# Patient Record
Sex: Male | Born: 1975 | Race: White | Hispanic: No | Marital: Married
Health system: Southern US, Community
[De-identification: ages and names within clinical notes are randomized; demographics above are authoritative.]

---

## 2002-06-22 ENCOUNTER — Encounter: Payer: Self-pay | Admitting: Emergency Medicine

## 2002-06-22 ENCOUNTER — Emergency Department (HOSPITAL_COMMUNITY): Admission: EM | Admit: 2002-06-22 | Discharge: 2002-06-22 | Payer: Self-pay | Admitting: Emergency Medicine

## 2006-10-08 ENCOUNTER — Ambulatory Visit: Payer: Self-pay | Admitting: Family Medicine

## 2006-10-10 ENCOUNTER — Encounter: Payer: Self-pay | Admitting: Family Medicine

## 2006-10-10 DIAGNOSIS — F411 Generalized anxiety disorder: Secondary | ICD-10-CM | POA: Insufficient documentation

## 2006-10-10 DIAGNOSIS — F329 Major depressive disorder, single episode, unspecified: Secondary | ICD-10-CM

## 2006-10-10 DIAGNOSIS — E669 Obesity, unspecified: Secondary | ICD-10-CM

## 2006-10-10 DIAGNOSIS — K219 Gastro-esophageal reflux disease without esophagitis: Secondary | ICD-10-CM | POA: Insufficient documentation

## 2006-10-10 DIAGNOSIS — M545 Low back pain: Secondary | ICD-10-CM

## 2006-10-10 DIAGNOSIS — G4730 Sleep apnea, unspecified: Secondary | ICD-10-CM | POA: Insufficient documentation

## 2006-10-14 ENCOUNTER — Encounter (INDEPENDENT_AMBULATORY_CARE_PROVIDER_SITE_OTHER): Payer: Self-pay | Admitting: Family Medicine

## 2006-10-14 LAB — CONVERTED CEMR LAB
ALT: 28 units/L (ref 0–53)
AST: 26 units/L (ref 0–37)
Cholesterol: 201 mg/dL — ABNORMAL HIGH (ref 0–200)
Creatinine, Ser: 0.92 mg/dL (ref 0.40–1.50)
Eosinophils Relative: 3 % (ref 0–5)
HDL: 34 mg/dL — ABNORMAL LOW (ref >39)
Hemoglobin: 14.2 g/dL (ref 13.0–17.0)
LDL Cholesterol: 143 mg/dL — ABNORMAL HIGH (ref 0–99)
Lymphocytes Relative: 37 % (ref 12–46)
MCHC: 32.9 g/dL (ref 30.0–36.0)
MCV: 89 fL (ref 78.0–100.0)
Monocytes Absolute: 0.6 10*3/uL (ref 0.2–0.7)
Monocytes Relative: 9 % (ref 3–11)
Neutro Abs: 3.4 10*3/uL (ref 1.7–7.7)
Neutrophils Relative %: 51 % (ref 43–77)
RBC: 4.84 M/uL (ref 4.22–5.81)
Sodium: 142 meq/L (ref 135–145)
Total CHOL/HDL Ratio: 5.9
Total Protein: 6.6 g/dL (ref 6.0–8.3)
Triglycerides: 121 mg/dL (ref <150)
VLDL: 24 mg/dL (ref 0–40)

## 2006-11-02 ENCOUNTER — Encounter (INDEPENDENT_AMBULATORY_CARE_PROVIDER_SITE_OTHER): Payer: Self-pay | Admitting: Family Medicine

## 2006-11-05 ENCOUNTER — Ambulatory Visit: Payer: Self-pay | Admitting: Family Medicine

## 2006-11-05 DIAGNOSIS — E785 Hyperlipidemia, unspecified: Secondary | ICD-10-CM

## 2006-11-05 LAB — CONVERTED CEMR LAB: Cholesterol, target level: 200 mg/dL

## 2006-11-06 ENCOUNTER — Encounter (INDEPENDENT_AMBULATORY_CARE_PROVIDER_SITE_OTHER): Payer: Self-pay | Admitting: Family Medicine

## 2006-11-10 LAB — CONVERTED CEMR LAB
ALT: 34 units/L (ref 0–53)
Alkaline Phosphatase: 61 units/L (ref 39–117)
Calcium: 9.7 mg/dL (ref 8.4–10.5)
Total Protein: 7.5 g/dL (ref 6.0–8.3)

## 2006-11-29 ENCOUNTER — Ambulatory Visit: Payer: Self-pay | Admitting: Family Medicine

## 2006-12-24 ENCOUNTER — Telehealth (INDEPENDENT_AMBULATORY_CARE_PROVIDER_SITE_OTHER): Payer: Self-pay | Admitting: Family Medicine

## 2007-01-17 ENCOUNTER — Encounter (INDEPENDENT_AMBULATORY_CARE_PROVIDER_SITE_OTHER): Payer: Self-pay | Admitting: Family Medicine

## 2007-01-21 ENCOUNTER — Telehealth (INDEPENDENT_AMBULATORY_CARE_PROVIDER_SITE_OTHER): Payer: Self-pay | Admitting: Family Medicine

## 2007-02-04 ENCOUNTER — Ambulatory Visit: Payer: Self-pay | Admitting: Family Medicine

## 2007-02-08 ENCOUNTER — Encounter (INDEPENDENT_AMBULATORY_CARE_PROVIDER_SITE_OTHER): Payer: Self-pay | Admitting: Family Medicine

## 2007-04-07 ENCOUNTER — Encounter (INDEPENDENT_AMBULATORY_CARE_PROVIDER_SITE_OTHER): Payer: Self-pay | Admitting: Family Medicine

## 2007-07-12 ENCOUNTER — Inpatient Hospital Stay (HOSPITAL_COMMUNITY): Admission: EM | Admit: 2007-07-12 | Discharge: 2007-07-14 | Payer: Self-pay | Admitting: Emergency Medicine

## 2007-07-14 ENCOUNTER — Encounter (INDEPENDENT_AMBULATORY_CARE_PROVIDER_SITE_OTHER): Payer: Self-pay | Admitting: Family Medicine

## 2007-07-18 ENCOUNTER — Encounter (INDEPENDENT_AMBULATORY_CARE_PROVIDER_SITE_OTHER): Payer: Self-pay | Admitting: Family Medicine

## 2007-07-19 ENCOUNTER — Ambulatory Visit: Payer: Self-pay | Admitting: Family Medicine

## 2007-07-19 LAB — CONVERTED CEMR LAB
Bilirubin Urine: NEGATIVE
Glucose, Urine, Semiquant: NEGATIVE
Nitrite: NEGATIVE
Urobilinogen, UA: 1
pH: 7

## 2007-07-20 ENCOUNTER — Telehealth (INDEPENDENT_AMBULATORY_CARE_PROVIDER_SITE_OTHER): Payer: Self-pay | Admitting: *Deleted

## 2007-07-20 LAB — CONVERTED CEMR LAB
HCT: 37.5 % — ABNORMAL LOW (ref 39.0–52.0)
MCV: 96.9 fL (ref 78.0–100.0)
Monocytes Relative: 9 % (ref 3–11)
Neutro Abs: 4.1 10*3/uL (ref 1.7–7.7)
Neutrophils Relative %: 66 % (ref 43–77)
RBC: 3.87 M/uL — ABNORMAL LOW (ref 4.22–5.81)
RDW: 14.6 % — ABNORMAL HIGH (ref 11.5–14.0)
WBC: 6.2 10*3/uL (ref 4.0–10.5)

## 2007-07-29 ENCOUNTER — Telehealth (INDEPENDENT_AMBULATORY_CARE_PROVIDER_SITE_OTHER): Payer: Self-pay | Admitting: Family Medicine

## 2007-08-01 ENCOUNTER — Telehealth (INDEPENDENT_AMBULATORY_CARE_PROVIDER_SITE_OTHER): Payer: Self-pay | Admitting: *Deleted

## 2007-08-01 ENCOUNTER — Ambulatory Visit: Payer: Self-pay | Admitting: Family Medicine

## 2007-08-01 ENCOUNTER — Ambulatory Visit (HOSPITAL_COMMUNITY): Admission: RE | Admit: 2007-08-01 | Discharge: 2007-08-01 | Payer: Self-pay | Admitting: Family Medicine

## 2007-08-02 ENCOUNTER — Encounter (INDEPENDENT_AMBULATORY_CARE_PROVIDER_SITE_OTHER): Payer: Self-pay | Admitting: Family Medicine

## 2007-08-05 ENCOUNTER — Encounter (INDEPENDENT_AMBULATORY_CARE_PROVIDER_SITE_OTHER): Payer: Self-pay | Admitting: Family Medicine

## 2007-08-05 ENCOUNTER — Telehealth (INDEPENDENT_AMBULATORY_CARE_PROVIDER_SITE_OTHER): Payer: Self-pay | Admitting: Family Medicine

## 2007-08-12 ENCOUNTER — Telehealth (INDEPENDENT_AMBULATORY_CARE_PROVIDER_SITE_OTHER): Payer: Self-pay | Admitting: Family Medicine

## 2007-08-16 ENCOUNTER — Ambulatory Visit (HOSPITAL_COMMUNITY): Admission: RE | Admit: 2007-08-16 | Discharge: 2007-08-16 | Payer: Self-pay | Admitting: Family Medicine

## 2007-08-22 ENCOUNTER — Telehealth (INDEPENDENT_AMBULATORY_CARE_PROVIDER_SITE_OTHER): Payer: Self-pay | Admitting: *Deleted

## 2007-08-26 ENCOUNTER — Encounter (INDEPENDENT_AMBULATORY_CARE_PROVIDER_SITE_OTHER): Payer: Self-pay | Admitting: Family Medicine

## 2007-08-31 ENCOUNTER — Encounter (INDEPENDENT_AMBULATORY_CARE_PROVIDER_SITE_OTHER): Payer: Self-pay | Admitting: Family Medicine

## 2007-10-13 ENCOUNTER — Encounter (INDEPENDENT_AMBULATORY_CARE_PROVIDER_SITE_OTHER): Payer: Self-pay | Admitting: Family Medicine

## 2007-11-10 ENCOUNTER — Encounter (INDEPENDENT_AMBULATORY_CARE_PROVIDER_SITE_OTHER): Payer: Self-pay | Admitting: Family Medicine

## 2007-11-10 ENCOUNTER — Telehealth (INDEPENDENT_AMBULATORY_CARE_PROVIDER_SITE_OTHER): Payer: Self-pay | Admitting: *Deleted

## 2007-12-08 ENCOUNTER — Encounter (INDEPENDENT_AMBULATORY_CARE_PROVIDER_SITE_OTHER): Payer: Self-pay | Admitting: Family Medicine

## 2007-12-29 ENCOUNTER — Encounter (INDEPENDENT_AMBULATORY_CARE_PROVIDER_SITE_OTHER): Payer: Self-pay | Admitting: Family Medicine

## 2008-01-11 ENCOUNTER — Encounter (INDEPENDENT_AMBULATORY_CARE_PROVIDER_SITE_OTHER): Payer: Self-pay | Admitting: Family Medicine

## 2008-01-16 ENCOUNTER — Ambulatory Visit: Payer: Self-pay | Admitting: Family Medicine

## 2008-01-16 ENCOUNTER — Telehealth (INDEPENDENT_AMBULATORY_CARE_PROVIDER_SITE_OTHER): Payer: Self-pay | Admitting: *Deleted

## 2008-01-16 ENCOUNTER — Ambulatory Visit (HOSPITAL_COMMUNITY): Admission: RE | Admit: 2008-01-16 | Discharge: 2008-01-16 | Payer: Self-pay | Admitting: Family Medicine

## 2008-01-16 DIAGNOSIS — M79609 Pain in unspecified limb: Secondary | ICD-10-CM

## 2008-01-17 ENCOUNTER — Telehealth (INDEPENDENT_AMBULATORY_CARE_PROVIDER_SITE_OTHER): Payer: Self-pay | Admitting: *Deleted

## 2008-01-17 LAB — CONVERTED CEMR LAB
ALT: 40 units/L (ref 0–53)
AST: 32 units/L (ref 0–37)
Albumin: 4.4 g/dL (ref 3.5–5.2)
BUN: 11 mg/dL (ref 6–23)
Basophils Absolute: 0 10*3/uL (ref 0.0–0.1)
CO2: 21 meq/L (ref 19–32)
Calcium: 8.7 mg/dL (ref 8.4–10.5)
Chloride: 106 meq/L (ref 96–112)
Creatinine, Ser: 0.88 mg/dL (ref 0.40–1.50)
HDL: 39 mg/dL — ABNORMAL LOW (ref >39)
Hemoglobin: 15.2 g/dL (ref 13.0–17.0)
Lymphs Abs: 2.3 10*3/uL (ref 0.7–4.0)
MCV: 96.4 fL (ref 78.0–100.0)
Monocytes Absolute: 0.7 10*3/uL (ref 0.1–1.0)
Monocytes Relative: 8 % (ref 3–12)
Neutro Abs: 6.2 10*3/uL (ref 1.7–7.7)
Neutrophils Relative %: 66 % (ref 43–77)
Potassium: 3.9 meq/L (ref 3.5–5.3)
RBC: 4.78 M/uL (ref 4.22–5.81)
TSH: 3.501 microintl units/mL (ref 0.350–5.50)
Total Bilirubin: 0.5 mg/dL (ref 0.3–1.2)
Total CHOL/HDL Ratio: 5.7
WBC: 9.4 10*3/uL (ref 4.0–10.5)

## 2008-01-23 ENCOUNTER — Telehealth (INDEPENDENT_AMBULATORY_CARE_PROVIDER_SITE_OTHER): Payer: Self-pay | Admitting: *Deleted

## 2008-01-23 ENCOUNTER — Telehealth (INDEPENDENT_AMBULATORY_CARE_PROVIDER_SITE_OTHER): Payer: Self-pay | Admitting: Family Medicine

## 2008-01-27 ENCOUNTER — Telehealth (INDEPENDENT_AMBULATORY_CARE_PROVIDER_SITE_OTHER): Payer: Self-pay | Admitting: *Deleted

## 2008-01-27 ENCOUNTER — Encounter (INDEPENDENT_AMBULATORY_CARE_PROVIDER_SITE_OTHER): Payer: Self-pay | Admitting: Family Medicine

## 2008-01-27 ENCOUNTER — Ambulatory Visit (HOSPITAL_COMMUNITY): Admission: RE | Admit: 2008-01-27 | Discharge: 2008-01-27 | Payer: Self-pay | Admitting: Family Medicine

## 2008-02-07 ENCOUNTER — Encounter (INDEPENDENT_AMBULATORY_CARE_PROVIDER_SITE_OTHER): Payer: Self-pay | Admitting: Family Medicine

## 2008-02-08 ENCOUNTER — Encounter (INDEPENDENT_AMBULATORY_CARE_PROVIDER_SITE_OTHER): Payer: Self-pay | Admitting: Family Medicine

## 2008-02-20 ENCOUNTER — Encounter (INDEPENDENT_AMBULATORY_CARE_PROVIDER_SITE_OTHER): Payer: Self-pay | Admitting: Family Medicine

## 2008-02-23 ENCOUNTER — Encounter (INDEPENDENT_AMBULATORY_CARE_PROVIDER_SITE_OTHER): Payer: Self-pay | Admitting: Family Medicine

## 2008-03-02 ENCOUNTER — Ambulatory Visit: Payer: Self-pay | Admitting: Family Medicine

## 2008-03-02 DIAGNOSIS — I10 Essential (primary) hypertension: Secondary | ICD-10-CM | POA: Insufficient documentation

## 2008-03-02 LAB — CONVERTED CEMR LAB
Bilirubin Urine: NEGATIVE
Blood in Urine, dipstick: NEGATIVE
Glucose, Urine, Semiquant: NEGATIVE
LDL Goal: 130 mg/dL
Nitrite: NEGATIVE
Protein, U semiquant: NEGATIVE
Specific Gravity, Urine: 1.02
Urobilinogen, UA: 0.2
WBC Urine, dipstick: NEGATIVE
pH: 6

## 2008-03-12 ENCOUNTER — Ambulatory Visit (HOSPITAL_COMMUNITY): Admission: RE | Admit: 2008-03-12 | Discharge: 2008-03-12 | Payer: Self-pay | Admitting: Surgery

## 2008-04-18 ENCOUNTER — Ambulatory Visit: Payer: Self-pay | Admitting: Family Medicine

## 2008-04-18 DIAGNOSIS — R079 Chest pain, unspecified: Secondary | ICD-10-CM

## 2008-05-01 ENCOUNTER — Encounter (INDEPENDENT_AMBULATORY_CARE_PROVIDER_SITE_OTHER): Payer: Self-pay | Admitting: Family Medicine

## 2008-05-08 ENCOUNTER — Encounter (INDEPENDENT_AMBULATORY_CARE_PROVIDER_SITE_OTHER): Payer: Self-pay | Admitting: Family Medicine

## 2008-05-10 ENCOUNTER — Ambulatory Visit: Payer: Self-pay | Admitting: Cardiology

## 2008-05-29 ENCOUNTER — Ambulatory Visit: Payer: Self-pay | Admitting: Cardiology

## 2008-06-20 ENCOUNTER — Telehealth (INDEPENDENT_AMBULATORY_CARE_PROVIDER_SITE_OTHER): Payer: Self-pay | Admitting: *Deleted

## 2008-09-06 ENCOUNTER — Telehealth (INDEPENDENT_AMBULATORY_CARE_PROVIDER_SITE_OTHER): Payer: Self-pay | Admitting: *Deleted

## 2008-10-02 ENCOUNTER — Telehealth (INDEPENDENT_AMBULATORY_CARE_PROVIDER_SITE_OTHER): Payer: Self-pay | Admitting: Family Medicine

## 2008-12-18 IMAGING — CR DG CERVICAL SPINE FLEX&EXT ONLY
3 series · 3 of 3 positions shown · non-contrast
Comparison: CT 07/11/2007

CLINICAL DATA: Neck pain.  Evaluate for instability.      
CERVICAL SPINE WITH FLEXION AND EXTENSION:

[w c-spine lat *]
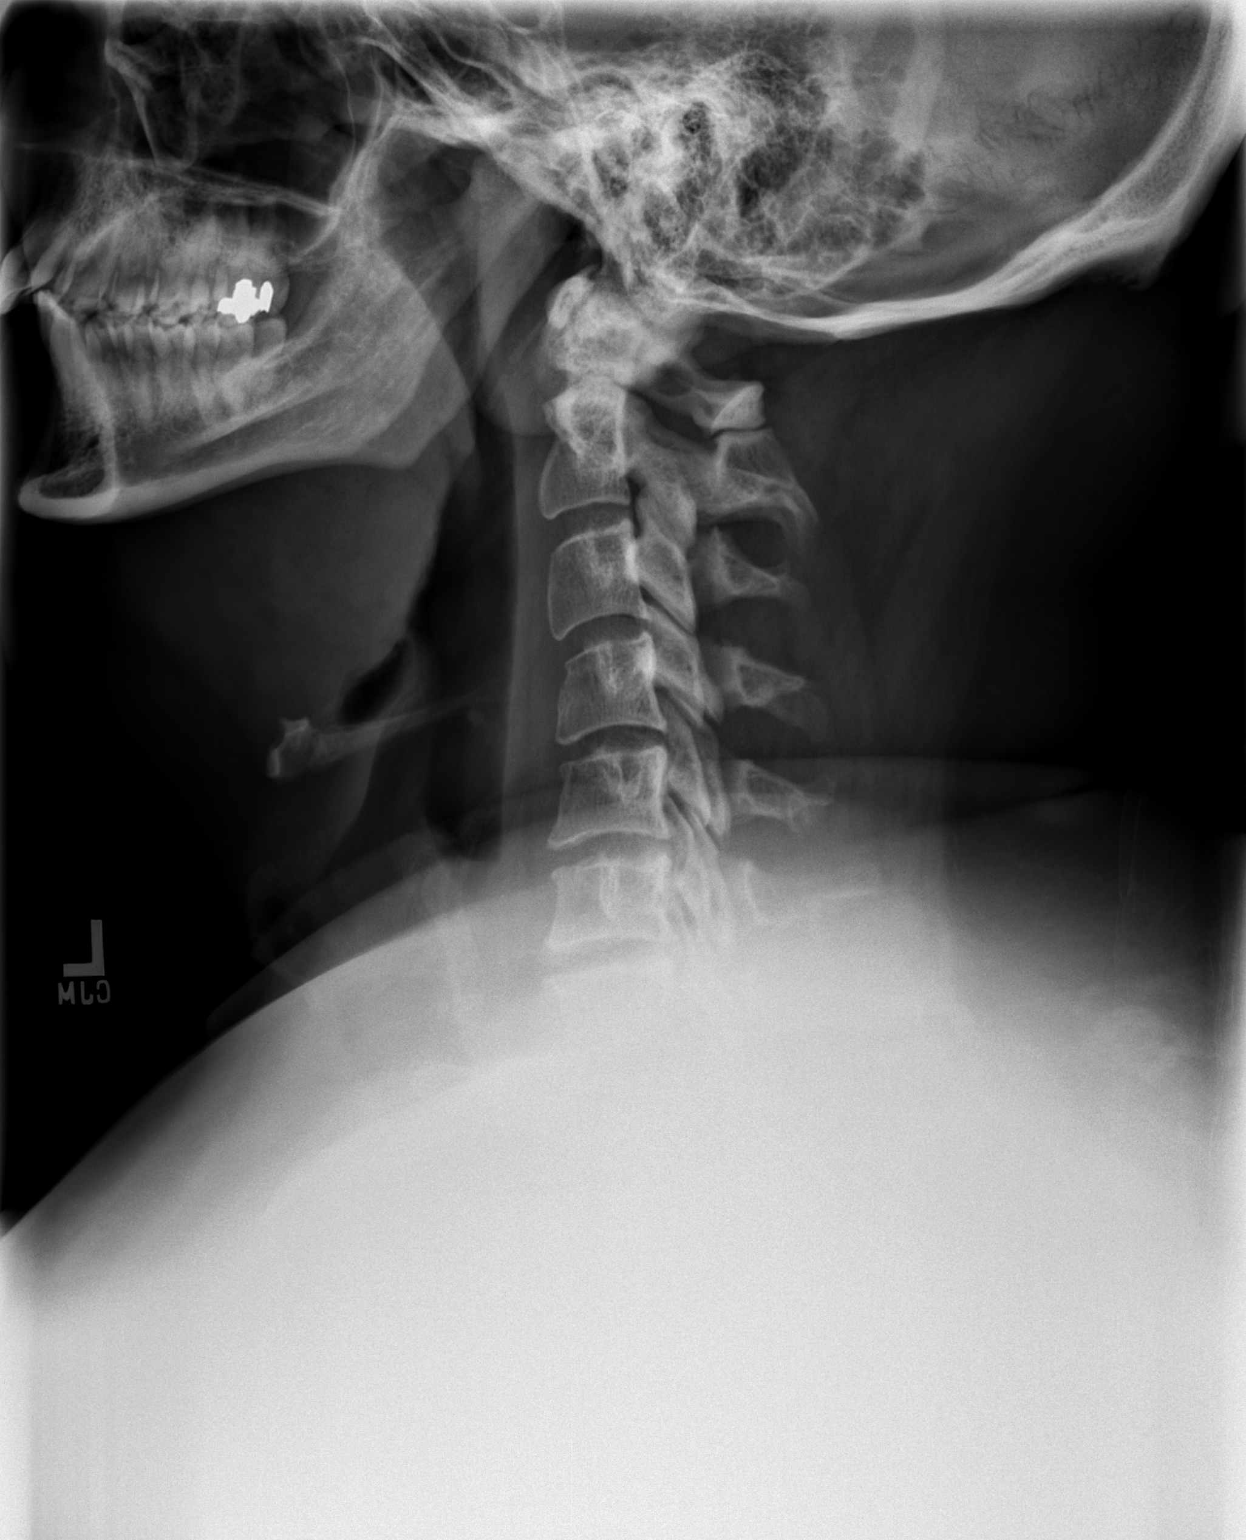

[w c-spine flexion *]
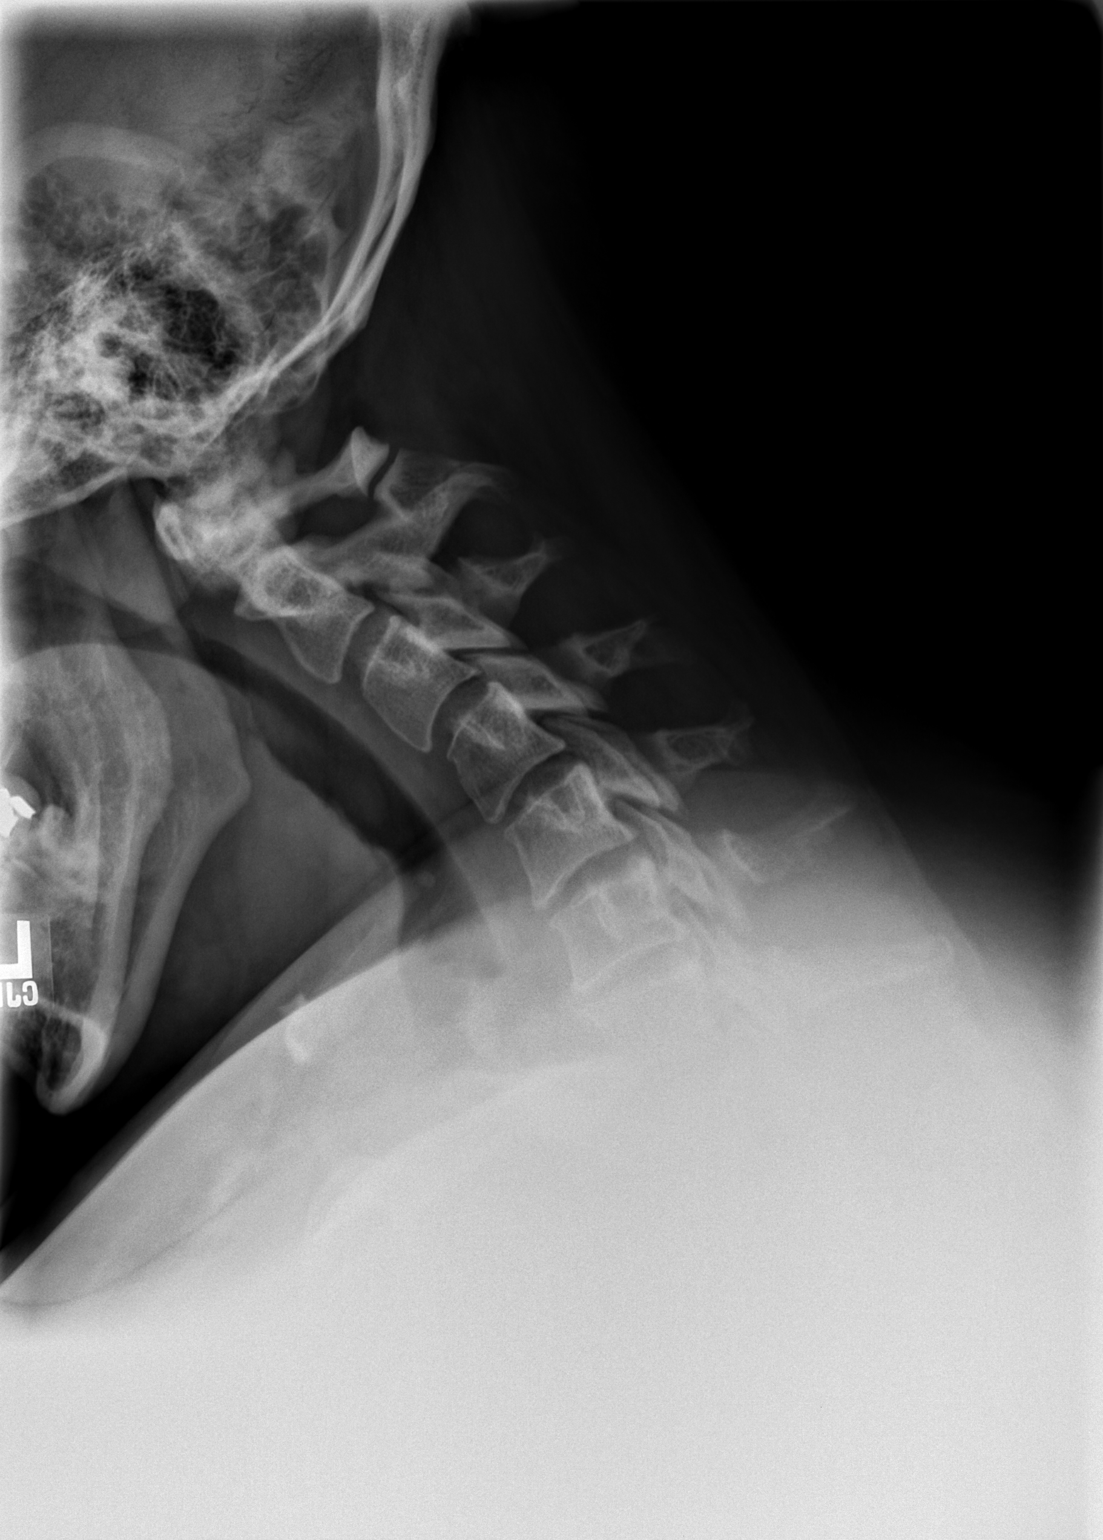

[w c-spine extension *]
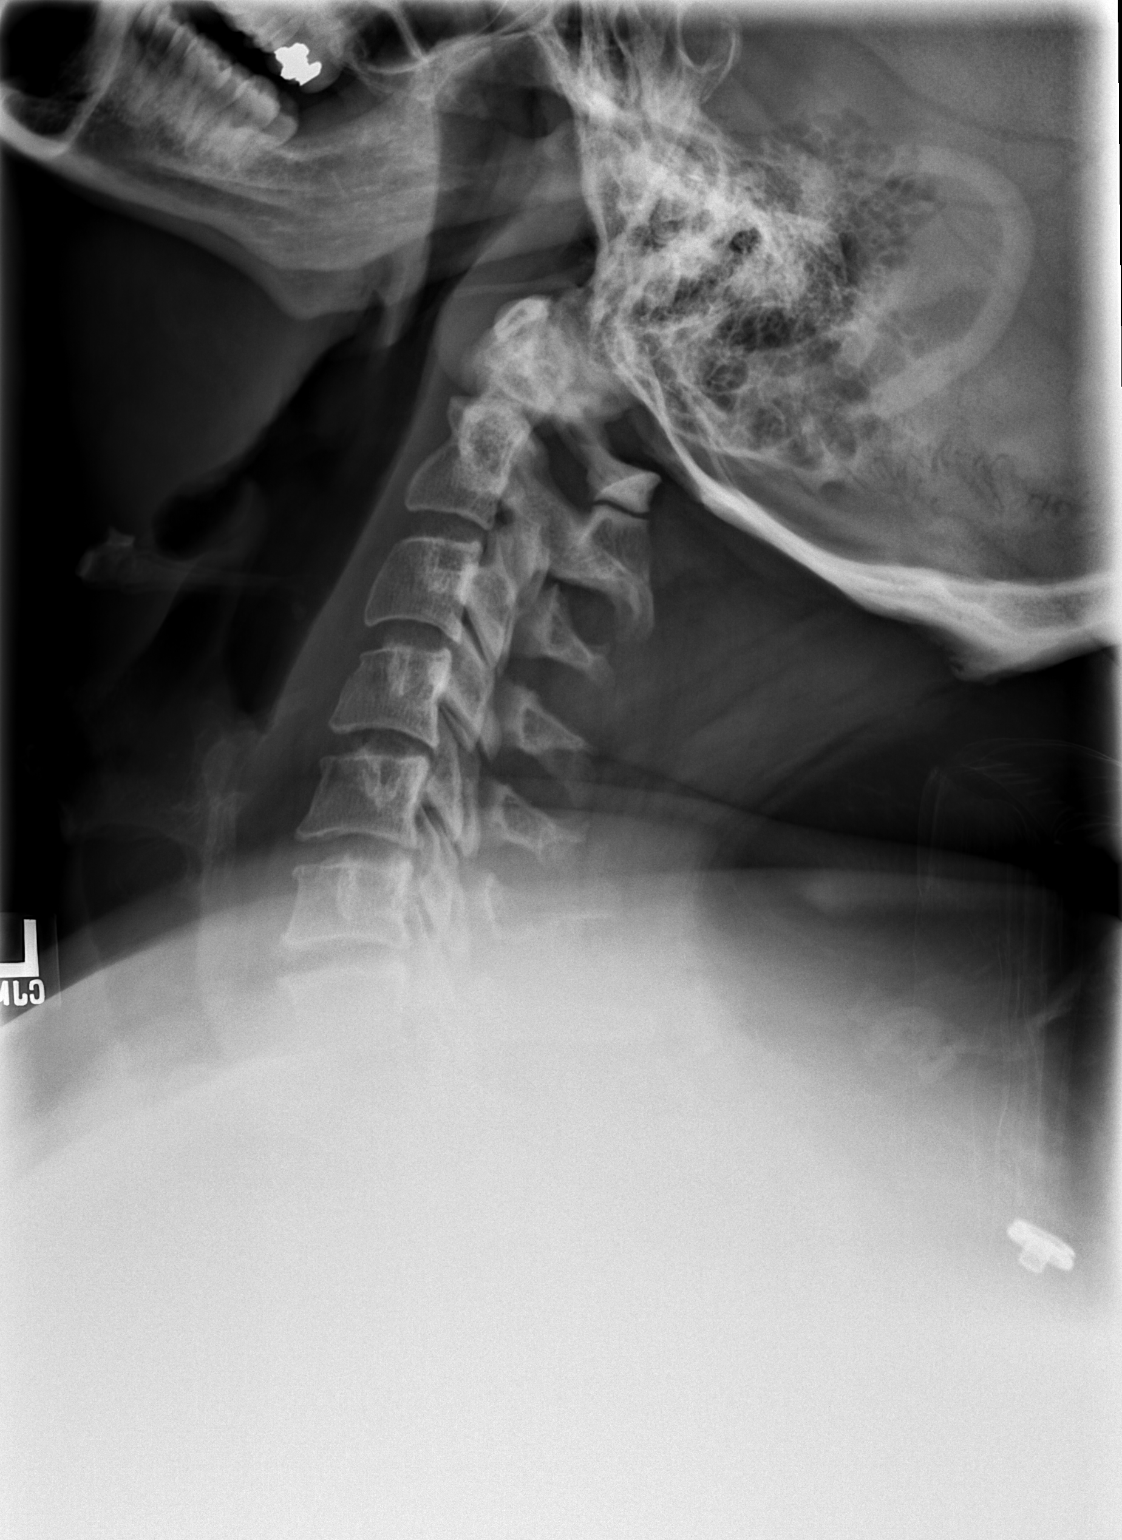

[3 of 3 positions shown; findings below may reference images not displayed]

FINDINGS: In the neutral position, there is normal alignment and there is mild kyphosis at C4-5.  No fracture is seen.  There is mild disk degeneration at C4-5, C5-6 and C6-7 with very mild spurring most prominent at C6-7.  
On flexion, there is increase in kyphotic angulation.  There is no abnormal movement.  On extension, there is reduction of the kyphosis with no significant lordosis  seen.  The alignment remains normal in flexion and extension.
IMPRESSION: Patient has cervical kyphosis.  No abnormal movement is seen and there is no fracture.

## 2009-03-18 DIAGNOSIS — R002 Palpitations: Secondary | ICD-10-CM

## 2009-06-23 IMAGING — CR DG FOOT COMPLETE 3+V*L*
3 series · 3 of 3 positions shown · non-contrast
Comparison: None

CLINICAL DATA: Left foot pain, no recent injury

LEFT FOOT - COMPLETE 3+ VIEW

[view not recorded (1 of 3)]
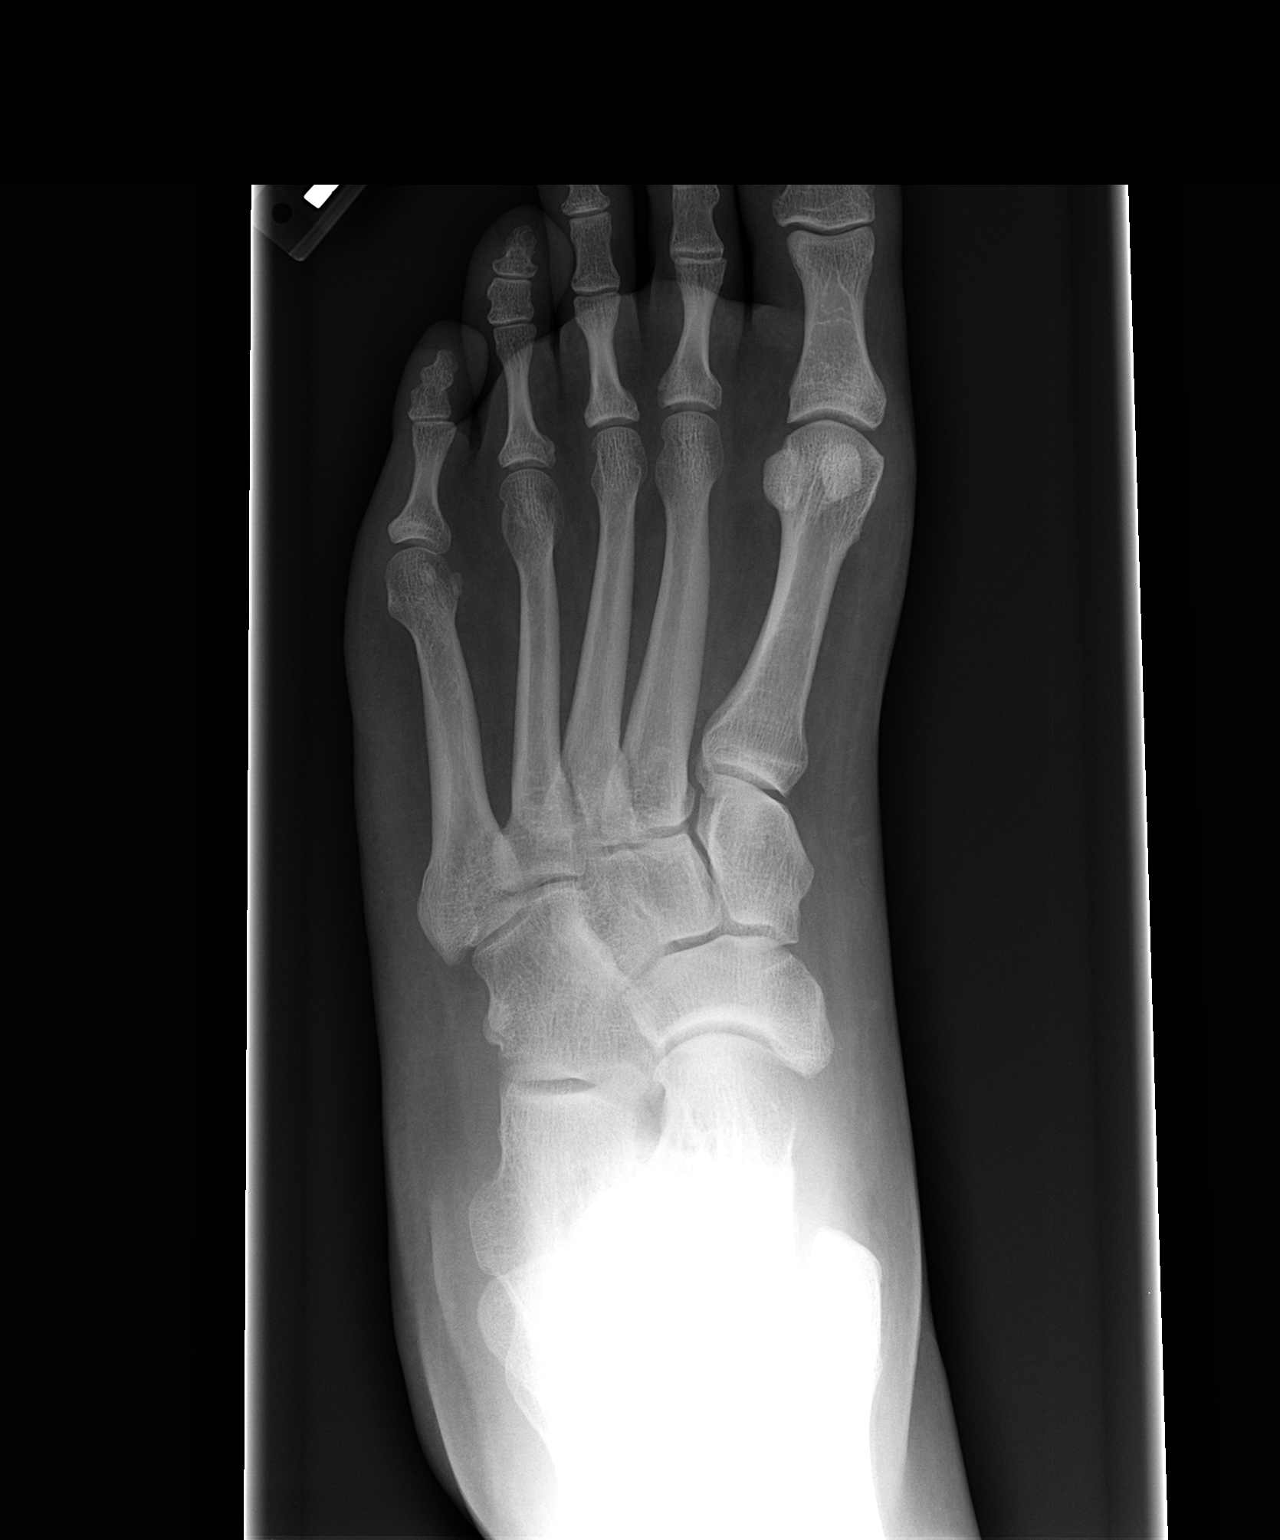

[view not recorded (2 of 3)]
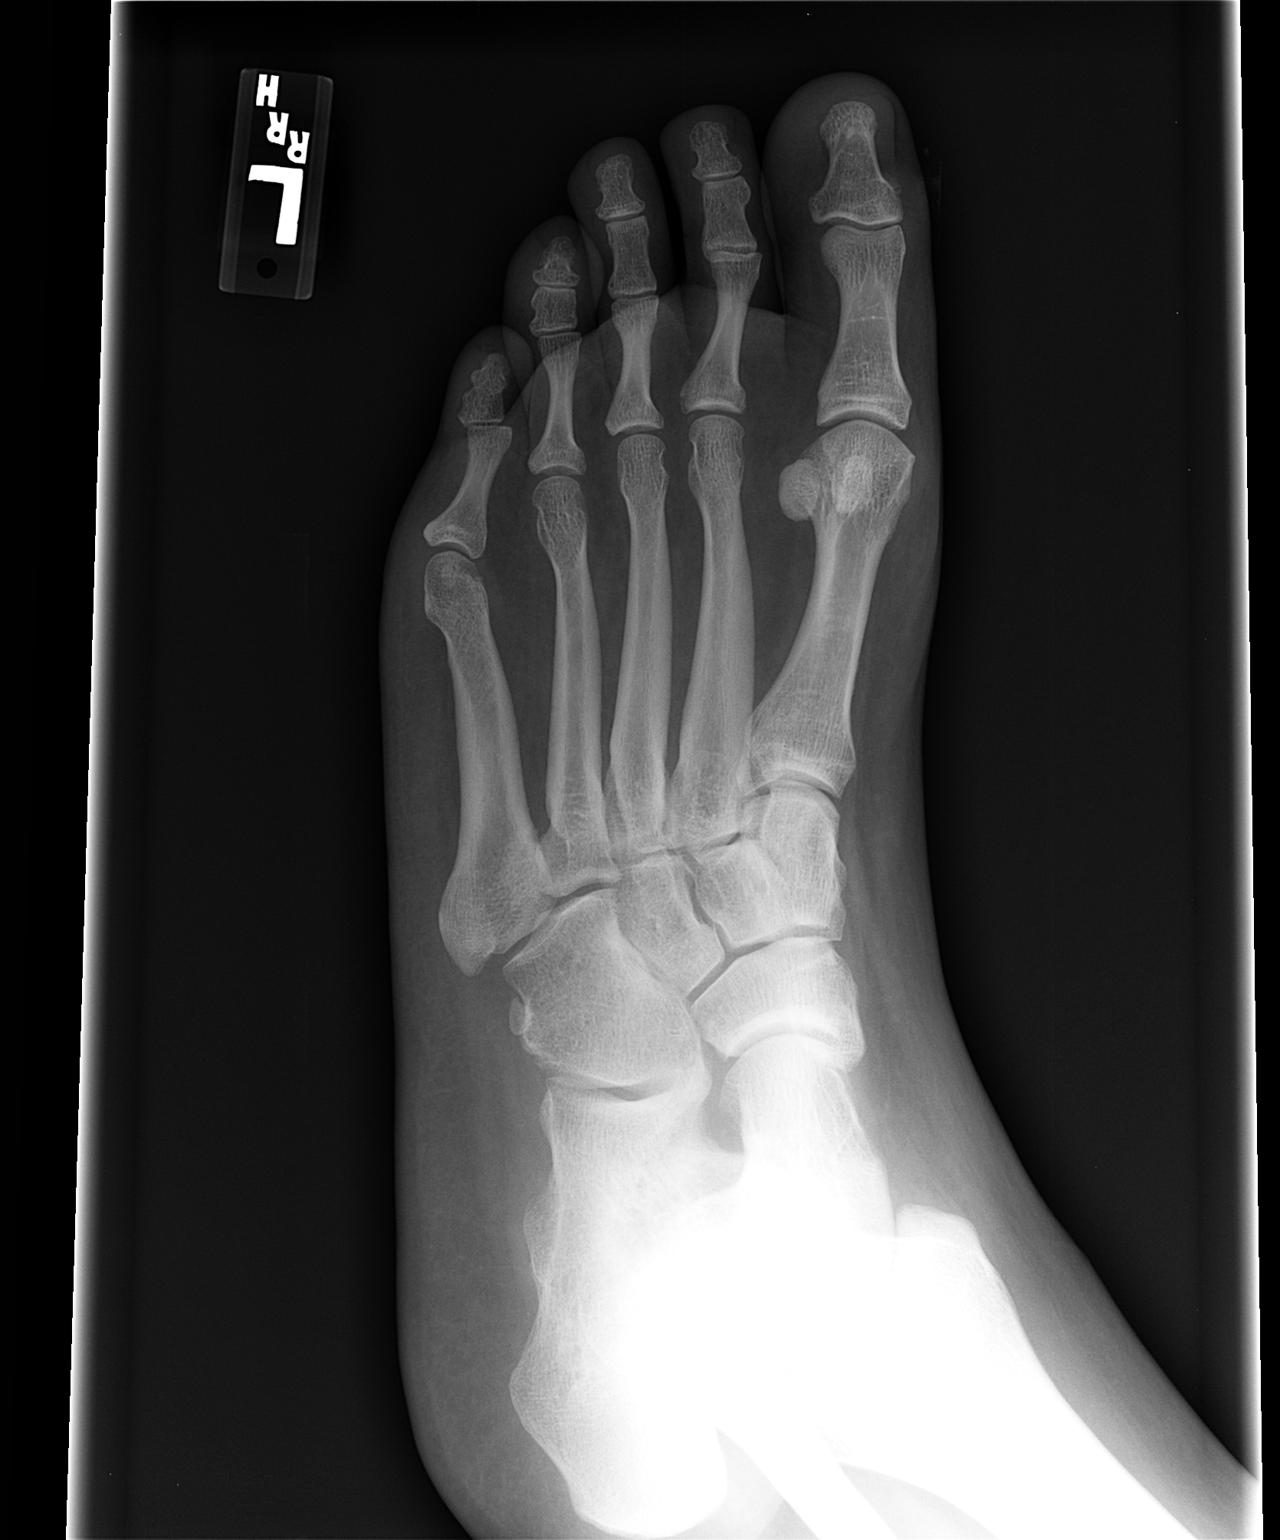

[view not recorded (3 of 3)]
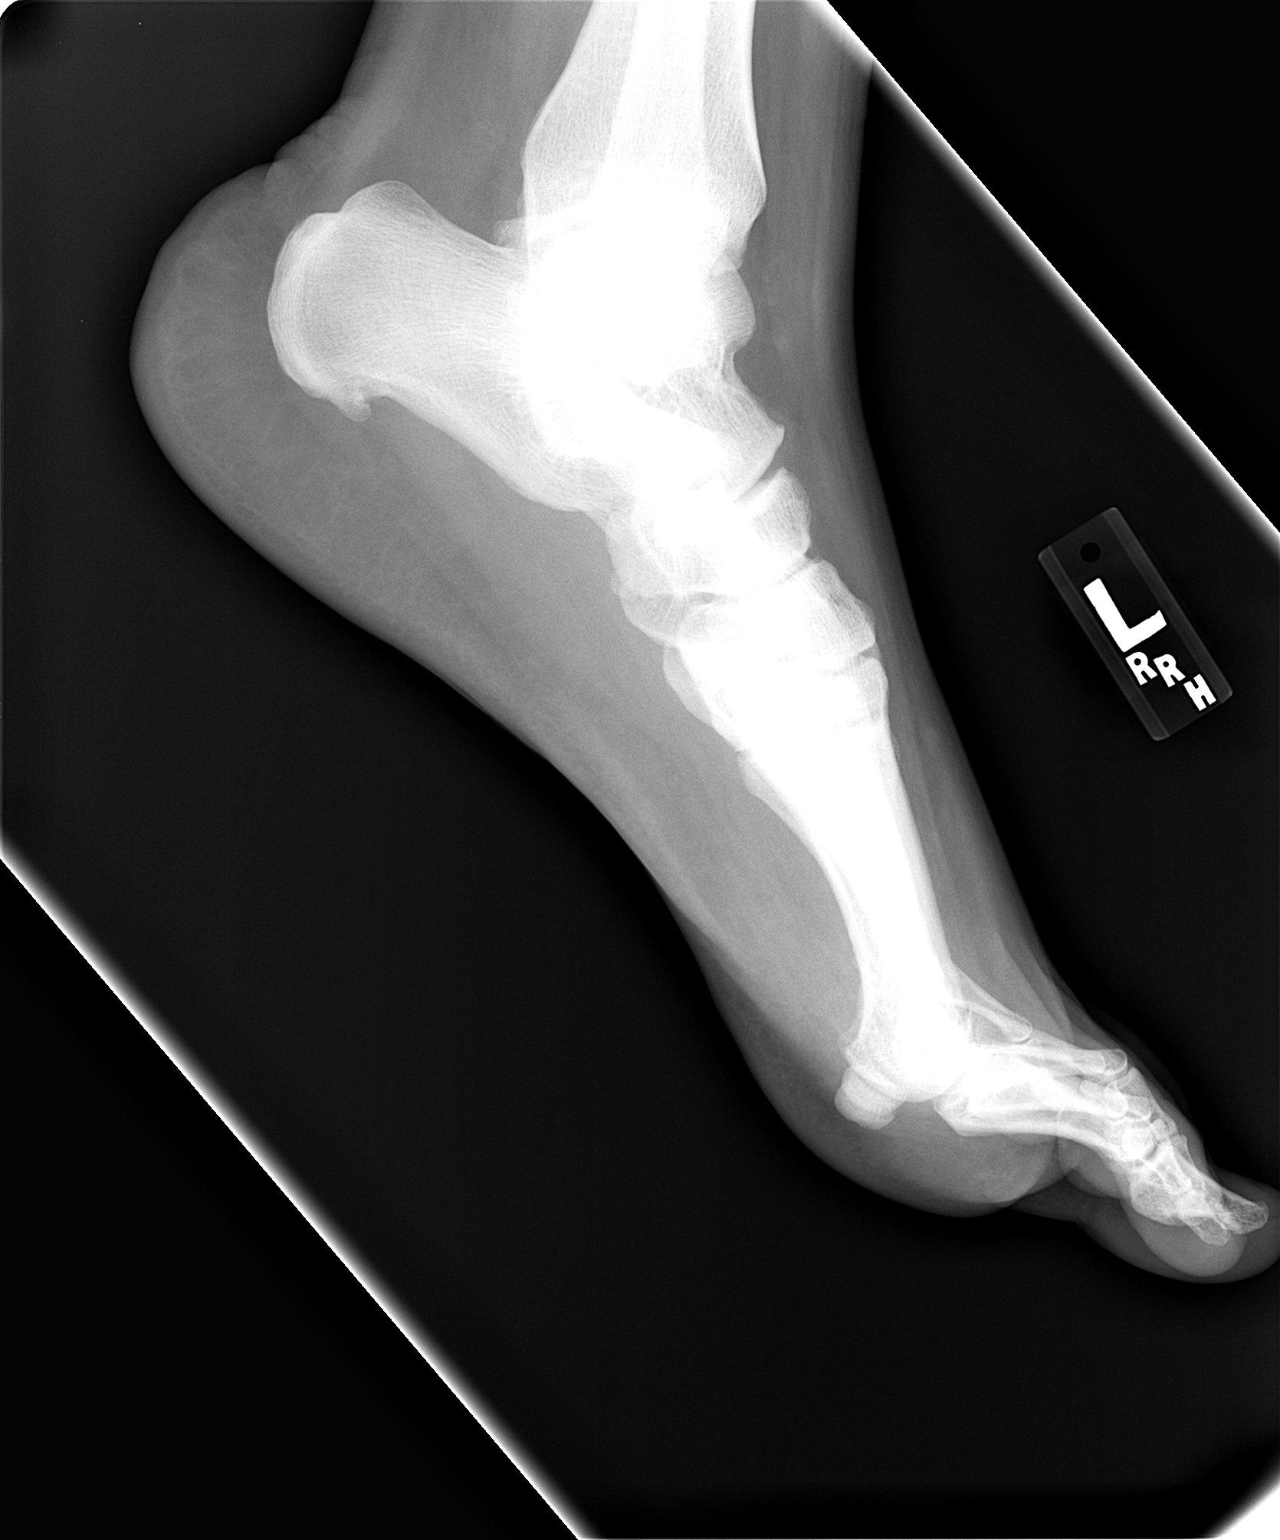

[3 of 3 positions shown; findings below may reference images not displayed]

FINDINGS: Spaces preserved.
Bone mineralization normal.
Small plantar calcaneal spur.
No acute fracture, dislocation, or bone destruction.
IMPRESSION: No acute bony abnormalities.

## 2010-01-17 ENCOUNTER — Encounter: Payer: Self-pay | Admitting: Family Medicine

## 2010-10-12 ENCOUNTER — Encounter: Payer: Self-pay | Admitting: Family Medicine

## 2010-10-19 LAB — CONVERTED CEMR LAB
Bilirubin Urine: NEGATIVE
Protein, U semiquant: NEGATIVE
Specific Gravity, Urine: 1.02

## 2010-10-21 NOTE — Letter (Signed)
Summary: medical releasle  medical releasle   Imported By: Lind Guest 01/17/2010 14:01:52  _____________________________________________________________________  External Attachment:    Type:   Image     Comment:   External Document  Appended Document: medical releasle pls send records requested

## 2010-11-09 ENCOUNTER — Emergency Department (HOSPITAL_COMMUNITY)
Admission: EM | Admit: 2010-11-09 | Discharge: 2010-11-09 | Disposition: A | Discharge status: Home / Self Care | Payer: 59 | Attending: Emergency Medicine | Admitting: Emergency Medicine

## 2010-11-09 DIAGNOSIS — Z79899 Other long term (current) drug therapy: Secondary | ICD-10-CM | POA: Insufficient documentation

## 2010-11-09 DIAGNOSIS — K6289 Other specified diseases of anus and rectum: Secondary | ICD-10-CM | POA: Insufficient documentation

## 2010-11-09 DIAGNOSIS — K644 Residual hemorrhoidal skin tags: Secondary | ICD-10-CM | POA: Insufficient documentation

## 2010-12-11 ENCOUNTER — Ambulatory Visit (INDEPENDENT_AMBULATORY_CARE_PROVIDER_SITE_OTHER): Payer: 59 | Admitting: Internal Medicine

## 2010-12-11 DIAGNOSIS — Z800 Family history of malignant neoplasm of digestive organs: Secondary | ICD-10-CM

## 2010-12-11 DIAGNOSIS — K6289 Other specified diseases of anus and rectum: Secondary | ICD-10-CM

## 2010-12-19 ENCOUNTER — Ambulatory Visit (HOSPITAL_COMMUNITY)
Admission: RE | Admit: 2010-12-19 | Discharge: 2010-12-19 | Disposition: A | Discharge status: Home / Self Care | Payer: 59 | Source: Ambulatory Visit | Attending: Internal Medicine | Admitting: Internal Medicine

## 2010-12-19 ENCOUNTER — Encounter (HOSPITAL_BASED_OUTPATIENT_CLINIC_OR_DEPARTMENT_OTHER): Payer: 59 | Admitting: Internal Medicine

## 2010-12-19 DIAGNOSIS — Z9884 Bariatric surgery status: Secondary | ICD-10-CM | POA: Insufficient documentation

## 2010-12-19 DIAGNOSIS — K6289 Other specified diseases of anus and rectum: Secondary | ICD-10-CM | POA: Insufficient documentation

## 2010-12-19 DIAGNOSIS — K644 Residual hemorrhoidal skin tags: Secondary | ICD-10-CM | POA: Insufficient documentation

## 2010-12-19 DIAGNOSIS — Z800 Family history of malignant neoplasm of digestive organs: Secondary | ICD-10-CM

## 2010-12-28 ENCOUNTER — Emergency Department (HOSPITAL_COMMUNITY)
Admission: EM | Admit: 2010-12-28 | Discharge: 2010-12-29 | Disposition: A | Discharge status: Home / Self Care | Payer: 59 | Attending: Emergency Medicine | Admitting: Emergency Medicine

## 2010-12-28 DIAGNOSIS — F329 Major depressive disorder, single episode, unspecified: Secondary | ICD-10-CM | POA: Insufficient documentation

## 2010-12-28 DIAGNOSIS — K612 Anorectal abscess: Secondary | ICD-10-CM | POA: Insufficient documentation

## 2010-12-28 DIAGNOSIS — K6289 Other specified diseases of anus and rectum: Secondary | ICD-10-CM | POA: Insufficient documentation

## 2010-12-28 DIAGNOSIS — K644 Residual hemorrhoidal skin tags: Secondary | ICD-10-CM | POA: Insufficient documentation

## 2010-12-28 DIAGNOSIS — K219 Gastro-esophageal reflux disease without esophagitis: Secondary | ICD-10-CM | POA: Insufficient documentation

## 2010-12-28 DIAGNOSIS — F3289 Other specified depressive episodes: Secondary | ICD-10-CM | POA: Insufficient documentation

## 2010-12-28 DIAGNOSIS — Z79899 Other long term (current) drug therapy: Secondary | ICD-10-CM | POA: Insufficient documentation

## 2010-12-30 NOTE — Consult Note (Signed)
[name], Weeks           ACCOUNT NO.:  [id number]  MEDICAL RECORD NO.:  [id number]           PATIENT TYPE: AMB.  LOCATION: [place].                   [facility]  PHYSICIAN:  Lionel December, M.D.    DATE OF BIRTH:  Jan 27, 1976  DAT : 12/11/2010.                                 CONSULTATION   REASON FOR CONSULTATION:  Rectal pain.  HISTORY OF PRESENT ILLNESS:  Steve Weeks is a 35 year old male referred to our office by Dr. Phillips Odor for rectal pain.  In December, he states he was constipated.  He strained to have a BM and after which he began to have rectal pain.  He finally tried a Fleet enema and had a good BM. This is when his rectal pain started.  He says he has constant pain when he sits down.  He was seen in the emergency room at [facility] 3 or 4 weeks ago and was given lidocaine 2% gel for this area.  He says his stools have not been hard since then.  He is eating a lot of fiber and Benefiber.  His appetite has remained good.  He has had weight loss but it was intentional.  He underwent a gastric bypass in October 2011 for obesity.  He does states that his pain usually occurs at night.  He states up until December he was fine and he has tried hydrocortisone suppositories and ProctoFoam with some relief, but after he finishes the prescription his pain will return.  He is having 1-2 bowel movements a day now.  His bowel movements were tan  in color.  He has not seen any blood in his stools.  There is no abdominal pain, however, he does have rectal pain.  ALLERGIES:  THERE IS NO KNOWN ALLERGIES.  SURGERIES:  He had a gastric bypass in July 14, 2010, for obesity. He still has his gallbladder.  PREVIOUS MEDICAL HISTORY:  Includes obesity and mild depression.  FAMILY HISTORY:  His mother is alive in good health.  His father is deceased from colon cancer, he was diagnosed at age 68.  He is deceased at age 68 from colon cancer.  He has one brother with a  mental disorder and EtOH abuse.  He is widowed.  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 He does not smoke, drink or do drugs.  He does not have any children.  HOME MEDICATIONS:  Include 1. Urso 250 b.i.d. 2. Prilosec 20 mg a day. 3. Lexapro 20 mg a day. 4. Phenergan as needed which is rare. 5. Vitamin D 50,000 units weekly. 6. Iron 18 mg with vitamin C 30 mg and FOS 5 mg daily. 7. Sublingual B12 1000 mcg with folic acid daily. 8. MVI daily. 9. Calcium 500 mg with vitamin K 20 mEq and D 300 international units     plus magnesium daily. 10.Omega-3, 500 mg a day.  OBJECTIVE:  VITAL SIGNS:  His weight is 272, his height is 6 feet 1 inch, his temperature is 99.2, his blood pressure is 102/50, his pulse is 100. HEENT:  He has natural teeth.  His oral mucosa is moist.  There are no lesions.  His conjunctivae  is pink.  His sclerae anicteric.  His thyroid is normal and there is no cervical lymphadenopathy. LUNGS:  Clear. HEART:  Regular rate and rhythm. ABDOMEN:  Obese.  Bowel sounds are positive.  No masses.  No tenderness. EXTREMITIES:  There is no edema to his extremities.  His stool was brown guaiac negative.  He had tenderness at 6 o'clock. There was a rough area at 6 o'clock also.  ASSESSMENT:  Mr. Steve Weeks is a 35 year old male with complaints of rectal pain since December.  I suspect that he has a rectal fissure at this point.  Though, there is a family history of colon cancer.  I did discuss this case with Dr. Karilyn Cota.  RECOMMENDATIONS:  We will schedule sigmoidoscopy with Dr. Karilyn Cota in the near future and the risk and benefits were reviewed with the patient and he is agreeable.    ______________________________ Dorene Ar, NP   ______________________________ Lionel December, M.D.    TS/MEDQ  D:  12/11/2010  T:  12/12/2010  Job:  [id number]  Electronically Signed by Dorene Ar PA on 12/23/2010 08:41:33 AM Electronically Signed by Lionel December M.D. on 12/30/2010  11:45:56 PM

## 2010-12-30 NOTE — Op Note (Signed)
  [name], [name]           ACCOUNT NO.:  [id number]  MEDICAL RECORD NO.:  [id number]           PATIENT TYPE:  O  LOCATION:  [place]                          FACILITY:  [facility]  PHYSICIAN:  Lionel December, M.D.    DATE OF BIRTH:  1975/10/25  DATE OF PROCEDURE:  12/19/2010                               OPERATIVE REPORT   PROCEDURE:  Flexible sigmoidoscopy.  INDICATIONS:  Steve Weeks is a 35 year old Caucasian male who has been experiencing intermittent rectal pain.  He had this sudden onset about 3 months ago when he was constipating and straining.  He has been seen in the emergency room on one occasion.  He has been treated with topicals. He is doing much better but he still has some residual pain and he describes to be inside and not involving the anal canal.  He has not experienced any rectal bleeding.  Since he had his bariatric surgery, he has not had any problems with constipation.  Procedure risks were reviewed with the patient.  Informed consent was obtained.  Please note that the patient's father was 58 at time of diagnosis of colon carcinoma and died 2 years later.  No other member of the family has had polyps and/or CRC.  Procedure risks were reviewed with the patient and informed consent was obtained.  MEDS FOR CONSCIOUS SEDATION:  Demerol 50 mg IV, Versed 5 mg IV in divided dose.  FINDINGS:  Procedure performed in endoscopy suite.  The patient's vital signs and O2 sat were monitored during the procedure and remained stable.  The patient was placed in left lateral recumbent position. Rectal examination performed.  No abnormality noted on external or digital exam.  Pentax videoscope was placed through rectum and advanced under vision into sigmoid colon beyond.  Scope was easily passed in the proximal transverse colon.  As the scope was withdrawn, colonic mucosa was carefully examined and was normal throughout.  Rectal mucosa similarly was normal.  Scope was retroflexed to  examine anorectal junction.  This area was carefully examined and there was no obvious fissure ulcer.  There was focal thickening to anoderm.  Furthermore, small hemorrhoids were also noted involving the anal canal below the dentate line.  Endoscope was then withdrawn.  The patient tolerated the procedure well.  FINAL DIAGNOSIS:  Small external hemorrhoids and focal thickening to anoderm, otherwise normal flexible sigmoidoscopy.  RECOMMENDATIONS:  He will continue high-fiber diet.  We will try Levsin SL one to two t.i.d. p.r.n. that he can use prior to or after his bowel movement if he is having discomfort.  If symptoms persist, he will call OV in 8-12 weeks.  Unless he has new symptoms, he should consider having his screening colonoscopy at age 48 and no later.     Lionel December, M.D.     NR/MEDQ  D:  12/19/2010  T:  12/20/2010  Job:  [id number]  cc:   Dr. Phillips Odor  Electronically Signed by Lionel December M.D. on 12/30/2010 11:46:48 PM
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
## 2011-07-01 LAB — URINALYSIS, ROUTINE W REFLEX MICROSCOPIC
Bilirubin Urine: NEGATIVE
Glucose, UA: NEGATIVE
Ketones, ur: 15 — AB
Leukocytes, UA: NEGATIVE
Nitrite: NEGATIVE
Specific Gravity, Urine: 1.038 — ABNORMAL HIGH
Urobilinogen, UA: 0.2

## 2011-07-01 LAB — URINE MICROSCOPIC-ADD ON

## 2011-07-01 LAB — COMPREHENSIVE METABOLIC PANEL
AST: 60 — ABNORMAL HIGH
CO2: 27
Chloride: 99
Creatinine, Ser: 0.87
GFR calc Af Amer: >60
Glucose, Bld: 146 — ABNORMAL HIGH
Potassium: 3.4 — ABNORMAL LOW
Sodium: 135
Total Protein: 6.5

## 2011-07-01 LAB — DIFFERENTIAL
Eosinophils Absolute: 0.1
Eosinophils Relative: 1
Lymphocytes Relative: 11 — ABNORMAL LOW
Lymphs Abs: 1.5
Monocytes Relative: 5
Neutrophils Relative %: 84 — ABNORMAL HIGH

## 2011-07-01 LAB — CBC
HCT: 38 — ABNORMAL LOW
MCHC: 33.8
MCHC: 34.7
MCV: 92.9
Platelets: 225
Platelets: 266
RDW: 13.7
RDW: 14
WBC: 8.9

## 2011-07-01 LAB — BASIC METABOLIC PANEL
BUN: 7
CO2: 28
Chloride: 103
Creatinine, Ser: 0.75
GFR calc Af Amer: >60
GFR calc Af Amer: >60
GFR calc non Af Amer: >60
GFR calc non Af Amer: >60
Glucose, Bld: 126 — ABNORMAL HIGH
Sodium: 135

## 2022-06-19 ENCOUNTER — Other Ambulatory Visit (HOSPITAL_BASED_OUTPATIENT_CLINIC_OR_DEPARTMENT_OTHER): Payer: Self-pay

## 2022-06-19 MED ORDER — SAXENDA 18 MG/3ML ~~LOC~~ SOPN
PEN_INJECTOR | SUBCUTANEOUS | 0 refills | Status: DC
  Filled 2022-06-19: qty 15, 30d supply, fill #0

## 2022-07-16 ENCOUNTER — Other Ambulatory Visit (HOSPITAL_BASED_OUTPATIENT_CLINIC_OR_DEPARTMENT_OTHER): Payer: Self-pay

## 2022-07-16 MED ORDER — SAXENDA 18 MG/3ML ~~LOC~~ SOPN
PEN_INJECTOR | SUBCUTANEOUS | 0 refills | Status: DC
  Filled 2022-07-16: qty 15, 30d supply, fill #0

## 2022-07-17 ENCOUNTER — Other Ambulatory Visit (HOSPITAL_BASED_OUTPATIENT_CLINIC_OR_DEPARTMENT_OTHER): Payer: Self-pay

## 2022-08-21 ENCOUNTER — Other Ambulatory Visit (HOSPITAL_BASED_OUTPATIENT_CLINIC_OR_DEPARTMENT_OTHER): Payer: Self-pay

## 2022-08-21 MED ORDER — SAXENDA 18 MG/3ML ~~LOC~~ SOPN
3.0000 mg | PEN_INJECTOR | Freq: Every day | SUBCUTANEOUS | 0 refills | Status: DC
  Filled 2022-08-21: qty 15, 30d supply, fill #0

## 2022-09-24 ENCOUNTER — Other Ambulatory Visit (HOSPITAL_BASED_OUTPATIENT_CLINIC_OR_DEPARTMENT_OTHER): Payer: Self-pay

## 2022-09-24 MED ORDER — SAXENDA 18 MG/3ML ~~LOC~~ SOPN
3.0000 mg | PEN_INJECTOR | Freq: Every day | SUBCUTANEOUS | 0 refills | Status: AC
  Filled 2022-09-24: qty 15, 30d supply, fill #0

## 2022-09-28 ENCOUNTER — Other Ambulatory Visit (HOSPITAL_BASED_OUTPATIENT_CLINIC_OR_DEPARTMENT_OTHER): Payer: Self-pay

## 2022-09-29 NOTE — Therapy (Signed)
OUTPATIENT PHYSICAL THERAPY THORACOLUMBAR EVALUATION   Patient Name: Steve Weeks MRN: [id number] DOB:Sep 30, 1975, 47 y.o., male Today's Date: 09/29/2022  END OF SESSION:   No past medical history on file. *** The histories are not reviewed yet. Please review them in the "History" navigator section and refresh this [place]. Patient Active Problem List   Diagnosis Date Noted   PALPITATIONS 03/18/2009   CHEST PAIN, LEFT 04/18/2008   HYPERTENSION, ESSENTIAL 03/02/2008   FOOT PAIN, LEFT 01/16/2008   HYPERLIPIDEMIA 11/05/2006   OBESITY 10/10/2006   ANXIETY 10/10/2006   DEPRESSION 10/10/2006   GERD 10/10/2006   LOW BACK PAIN, CHRONIC 10/10/2006   SLEEP APNEA 10/10/2006    PCP: Sharilyn Sites  REFERRING PROVIDER: Vickki Hearing  REFERRING DIAG: M54.50  Rationale for Evaluation and Treatment: Rehabilitation  THERAPY DIAG:  No diagnosis found.  ONSET DATE: ***  SUBJECTIVE:                                                                                                                                                                                           SUBJECTIVE STATEMENT: ***  PERTINENT HISTORY:  Morbid obesity, HTN  PAIN:  Are you having pain? {OPRCPAIN:27236}  PRECAUTIONS: {Therapy precautions:24002}  WEIGHT BEARING RESTRICTIONS: {Yes ***/No:24003}  FALLS:  Has patient fallen in last 6 months? {fallsyesno:27318}  LIVING ENVIRONMENT: Lives with: {OPRC lives with:25569::"lives with their family"} Lives in: {Lives in:25570} Stairs: {opstairs:27293} Has following equipment at home: {Assistive devices:23999}  OCCUPATION: ***  PLOF: {PLOF:24004}  PATIENT GOALS: ***  NEXT MD VISIT:   OBJECTIVE:   DIAGNOSTIC FINDINGS:  ***  PATIENT SURVEYS:  {rehab surveys:24030}  SCREENING FOR RED FLAGS: Bowel or bladder incontinence: {Yes/No:304960894} Spinal tumors: {Yes/No:304960894} Cauda equina syndrome: {Yes/No:304960894} Compression fracture:  {Yes/No:304960894} Abdominal aneurysm: {Yes/No:304960894}  COGNITION: Overall cognitive status: {cognition:24006}     SENSATION: {sensation:27233}  MUSCLE LENGTH: Hamstrings: Right *** deg; Left *** deg Thomas test: Right *** deg; Left *** deg  POSTURE: {posture:25561}  PALPATION: ***  LUMBAR ROM:   AROM eval  Flexion   Extension   Right lateral flexion   Left lateral flexion   Right rotation   Left rotation    (Blank rows = not tested)  LOWER EXTREMITY ROM:     {AROM/PROM:27142}  Right eval Left eval  Hip flexion    Hip extension    Hip abduction    Hip adduction    Hip internal rotation    Hip external rotation    Knee flexion    Knee extension    Ankle dorsiflexion    Ankle plantarflexion    Ankle inversion    Ankle eversion     (  Blank rows = not tested)  LOWER EXTREMITY MMT:    MMT Right eval Left eval  Hip flexion    Hip extension    Hip abduction    Hip adduction    Hip internal rotation    Hip external rotation    Knee flexion    Knee extension    Ankle dorsiflexion    Ankle plantarflexion    Ankle inversion    Ankle eversion     (Blank rows = not tested)  LUMBAR SPECIAL TESTS:  {lumbar special test:25242}  FUNCTIONAL TESTS:  {Functional tests:24029}  GAIT: Distance walked: *** Assistive device utilized: {Assistive devices:23999} Level of assistance: {Levels of assistance:24026} Comments: ***  TODAY'S TREATMENT:                                                                                                                              DATE: ***    PATIENT EDUCATION:  Education details: *** Person educated: {Person educated:25204} Education method: {Education Method:25205} Education comprehension: {Education Comprehension:25206}  HOME EXERCISE PROGRAM: ***  ASSESSMENT:  CLINICAL IMPRESSION: Patient is a *** y.o. *** who was seen today for physical therapy evaluation and treatment for ***.   OBJECTIVE IMPAIRMENTS:  {opptimpairments:25111}.   ACTIVITY LIMITATIONS: {activitylimitations:27494}  PARTICIPATION LIMITATIONS: {participationrestrictions:25113}  PERSONAL FACTORS: {Personal factors:25162} are also affecting patient's functional outcome.   REHAB POTENTIAL: {rehabpotential:25112}  CLINICAL DECISION MAKING: {clinical decision making:25114}  EVALUATION COMPLEXITY: {Evaluation complexity:25115}   GOALS: Goals reviewed with patient? {yes/no:20286}  SHORT TERM GOALS: Target date: ***  *** Baseline: Goal status: {GOALSTATUS:25110}  2.  *** Baseline:  Goal status: {GOALSTATUS:25110}  3.  *** Baseline:  Goal status: {GOALSTATUS:25110}  4.  *** Baseline:  Goal status: {GOALSTATUS:25110}  5.  *** Baseline:  Goal status: {GOALSTATUS:25110}  6.  *** Baseline:  Goal status: {GOALSTATUS:25110}  LONG TERM GOALS: Target date: ***  *** Baseline:  Goal status: {GOALSTATUS:25110}  2.  *** Baseline:  Goal status: {GOALSTATUS:25110}  3.  *** Baseline:  Goal status: {GOALSTATUS:25110}  4.  *** Baseline:  Goal status: {GOALSTATUS:25110}  5.  *** Baseline:  Goal status: {GOALSTATUS:25110}  6.  *** Baseline:  Goal status: {GOALSTATUS:25110}  PLAN:  PT FREQUENCY: {rehab frequency:25116}  PT DURATION: {rehab duration:25117}  PLANNED INTERVENTIONS: {rehab planned interventions:25118::"Therapeutic exercises","Therapeutic activity","Neuromuscular re-education","Balance training","Gait training","Patient/Family education","Self Care","Joint mobilization"}.  PLAN FOR NEXT SESSION: ***   Cassie Freer, PT 09/29/2022, 11:52 AM

## 2022-09-30 ENCOUNTER — Other Ambulatory Visit (HOSPITAL_BASED_OUTPATIENT_CLINIC_OR_DEPARTMENT_OTHER): Payer: Self-pay

## 2022-09-30 MED ORDER — WEGOVY 1.7 MG/0.75ML ~~LOC~~ SOAJ
1.7000 mg | SUBCUTANEOUS | 0 refills | Status: DC
  Filled 2022-09-30: qty 3, 28d supply, fill #0

## 2022-10-01 ENCOUNTER — Ambulatory Visit: Payer: BC Managed Care – PPO | Attending: Sports Medicine

## 2022-10-01 DIAGNOSIS — M5416 Radiculopathy, lumbar region: Secondary | ICD-10-CM | POA: Insufficient documentation

## 2022-10-01 DIAGNOSIS — M5459 Other low back pain: Secondary | ICD-10-CM | POA: Diagnosis present

## 2022-10-01 DIAGNOSIS — M6281 Muscle weakness (generalized): Secondary | ICD-10-CM | POA: Insufficient documentation

## 2022-10-02 ENCOUNTER — Other Ambulatory Visit (HOSPITAL_BASED_OUTPATIENT_CLINIC_OR_DEPARTMENT_OTHER): Payer: Self-pay

## 2022-10-05 ENCOUNTER — Other Ambulatory Visit (HOSPITAL_BASED_OUTPATIENT_CLINIC_OR_DEPARTMENT_OTHER): Payer: Self-pay

## 2022-10-07 ENCOUNTER — Other Ambulatory Visit (HOSPITAL_BASED_OUTPATIENT_CLINIC_OR_DEPARTMENT_OTHER): Payer: Self-pay

## 2022-10-08 ENCOUNTER — Ambulatory Visit: Payer: BC Managed Care – PPO | Admitting: Physical Therapy

## 2022-10-08 DIAGNOSIS — M6281 Muscle weakness (generalized): Secondary | ICD-10-CM | POA: Diagnosis not present

## 2022-10-08 DIAGNOSIS — M5459 Other low back pain: Secondary | ICD-10-CM

## 2022-10-08 NOTE — Therapy (Signed)
OUTPATIENT PHYSICAL THERAPY THORACOLUMBAR    Patient Name: Steve Weeks MRN: [id number] DOB:1976/09/07, 46 y.o., male Today's Date: 10/08/2022  END OF SESSION:  PT End of Session - 10/08/22 1615     Visit Number 2    Date for PT Re-Evaluation 11/12/22    Authorization Type BCBS    PT Start Time 1615    PT Stop Time 1700    PT Time Calculation (min) 45 min             No past medical history on file.  Patient Active Problem List   Diagnosis Date Noted   PALPITATIONS 03/18/2009   CHEST PAIN, LEFT 04/18/2008   HYPERTENSION, ESSENTIAL 03/02/2008   FOOT PAIN, LEFT 01/16/2008   HYPERLIPIDEMIA 11/05/2006   OBESITY 10/10/2006   ANXIETY 10/10/2006   DEPRESSION 10/10/2006   GERD 10/10/2006   LOW BACK PAIN, CHRONIC 10/10/2006   SLEEP APNEA 10/10/2006    PCP: Sharilyn Sites  REFERRING PROVIDER: Vickki Hearing  REFERRING DIAG: M54.50  Rationale for Evaluation and Treatment: Rehabilitation  THERAPY DIAG:  Muscle weakness (generalized)  Other low back pain  ONSET DATE: 08/15/2022  SUBJECTIVE:                                                                                                                                                                                           SUBJECTIVE STATEMENT: Doing HEP. Back pain minimal and NO leg pain PERTINENT HISTORY:  HTN  PAIN:  Are you having pain? Yes: NPRS scale: 1/10 Pain location: low back Pain description: sore, achy Aggravating factors: sitting long hours Relieving factors: movement  PRECAUTIONS: None  WEIGHT BEARING RESTRICTIONS: No  FALLS:  Has patient fallen in last 6 months? No  LIVING ENVIRONMENT: Lives with: lives with their spouse Lives in: House/apartment Stairs: Yes: Internal: 12 steps; can reach both Has following equipment at home: None  OCCUPATION: IT works at computer   PLOF: [place]: to learn new way to strengthen my back and prevent this from happening again  in the future.  OBJECTIVE:   DIAGNOSTIC FINDINGS:  Has MRI scheduled    SCREENING FOR RED FLAGS: Bowel or bladder incontinence: No Spinal tumors: No Cauda equina syndrome: No Compression fracture: No Abdominal aneurysm: No  COGNITION: Overall cognitive status: Within functional limits for tasks assessed     SENSATION: WFL  MUSCLE LENGTH: Hamstrings: some tightness in leg muscles   POSTURE: No Significant postural limitations and rounded shoulders  LUMBAR ROM:   AROM eval  Flexion Can touch toes  Extension WFL  Right lateral flexion WFL  Left lateral flexion Cramping on L  side  Right rotation WFL  Left rotation WFL   (Blank rows = not tested)  LOWER EXTREMITY ROM:   grossly WFL    LOWER EXTREMITY MMT:  grossly 5/5   LUMBAR SPECIAL TESTS:  Straight leg raise test: Negative and Slump test: Negative  FUNCTIONAL TESTS:  30 seconds chair stand test 10 reps some back in L knee TODAY'S TREATMENT:                                                                                                                              DATE:   10/08/22 Ppt10x Ppt with green tband clam and hip flex 2 sets 10 each Ppt with SLR 10 x, then SLR with abd 10 x Above issued as additional HEP  [id number] Feet on ball bridge 10 x,HS curl with bridge 10 x,obl 20 x Isometric abdominals 15 x hold 3 sec Seated row 35# 2 sets 10 Lat pull 35# 2 sets 10 Black tband trunk ext 20 x Red tband hip ext and abd 10 BIL   10/01/22- EVAL   PATIENT EDUCATION:  Education details HEP  [email or web address] Person educated: Patient Education method: Explanation, demo ,HO Education comprehension: verbalized understanding and demo  HOME EXERCISE PROGRAM: Access Code: [email or web address] URL: [email or web address] Date: 10/01/2022 Prepared by: Andris Baumann  Exercises - Supine Bridge  - 1 x daily - 7 x weekly - 2 sets - 10 reps - Supine Dead Bug with Leg Extension  - 1 x daily - 7 x weekly - 2 sets - 10  reps - Full Superman on Table  - 1 x daily - 7 x weekly - 2 sets - 10 reps  ASSESSMENT:  CLINICAL IMPRESSION: Checked and advanced HEP. Explained disc management and importance of core strengthening. Core strenthening REHAB POTENTIAL: Good  CLINICAL DECISION MAKING: Stable/uncomplicated  EVALUATION COMPLEXITY: Low   GOALS: Goals reviewed with patient? Yes  SHORT TERM GOALS: Target date: 10/29/22  Patient will be independent with initial HEP.  Goal status: 10/08/22 MET   LONG TERM GOALS: Target date: 11/12/22  Patient will be independent with advanced/ongoing HEP to improve outcomes and carryover.  Goal status: INITIAL  2.  Patient will report no back pain in a 4 week period.  Baseline: 1/10 Goal status: INITIAL  3.  Patient will demonstrate improved core strength with a 30s plank Goal status: INITIAL   PLAN:  PT FREQUENCY: 1x/week  PT DURATION: 6 weeks  PLANNED INTERVENTIONS: Therapeutic exercises, Therapeutic activity, Neuromuscular re-education, Balance training, Gait training, Patient/Family education, Self Care, Joint mobilization, Stair training, Electrical stimulation, Cryotherapy, Moist heat, Traction, and Manual therapy.  PLAN FOR NEXT SESSION: core and back strengthening, traction if any sciatica returns?   Kymorah Korf,ANGIE, PTA 10/08/2022, 4:17 PM [place]. [place], [place], [address] Phone: [phone]   Fax:  [phone]  Patient Details  Name: Steve Weeks MRN: [id number] Date of Birth: 04-30-1976 Referring Provider:  Reather Converse, [place]  Encounter Date: 10/08/2022   Nicola Girt 10/08/2022, 4:16 PM  [facility] [place] [address] [place], [place], [address] Phone: [phone]   Fax:  [phone]

## 2022-10-12 ENCOUNTER — Other Ambulatory Visit (HOSPITAL_BASED_OUTPATIENT_CLINIC_OR_DEPARTMENT_OTHER): Payer: Self-pay

## 2022-10-15 ENCOUNTER — Ambulatory Visit: Payer: BC Managed Care – PPO | Admitting: Physical Therapy

## 2022-10-15 DIAGNOSIS — M6281 Muscle weakness (generalized): Secondary | ICD-10-CM

## 2022-10-15 DIAGNOSIS — M5459 Other low back pain: Secondary | ICD-10-CM

## 2022-10-15 NOTE — Therapy (Signed)
OUTPATIENT PHYSICAL THERAPY THORACOLUMBAR    Patient Name: Steve Weeks MRN: [id number] DOB:[birth year], 47 y.o., male Today's Date: 10/15/2022  END OF SESSION:  PT End of Session - 10/15/22 1603     Visit Number 3    Date for PT Re-Evaluation 11/12/22    Authorization Type BCBS    PT Start Time 5093    PT Stop Time 2671    PT Time Calculation (min) 40 min             No past medical history on file.  Patient Active Problem List   Diagnosis Date Noted   PALPITATIONS 03/18/2009   CHEST PAIN, LEFT 04/18/2008   HYPERTENSION, ESSENTIAL 03/02/2008   FOOT PAIN, LEFT 01/16/2008   HYPERLIPIDEMIA 11/05/2006   OBESITY 10/10/2006   ANXIETY 10/10/2006   DEPRESSION 10/10/2006   GERD 10/10/2006   LOW BACK PAIN, CHRONIC 10/10/2006   SLEEP APNEA 10/10/2006    PCP: Sharilyn Sites  REFERRING PROVIDER: Vickki Hearing  REFERRING DIAG: M54.50  Rationale for Evaluation and Treatment: Rehabilitation  THERAPY DIAG:  Muscle weakness (generalized)  Other low back pain  ONSET DATE: 08/15/2022  SUBJECTIVE:                                                                                                                                                                                           SUBJECTIVE STATEMENT: HEP is good. [place]- just enough to notice it. Moving in right direction. Denies radiating pain PERTINENT HISTORY:  HTN  PAIN:  Are you having pain? Yes: NPRS scale: 1/10 Pain location: low back Pain description: sore, achy Aggravating factors: sitting long hours Relieving factors: movement  PRECAUTIONS: None  WEIGHT BEARING RESTRICTIONS: No  FALLS:  Has patient fallen in last 6 months? No  LIVING ENVIRONMENT: Lives with: lives with their spouse Lives in: House/apartment Stairs: Yes: Internal: 12 steps; can reach both Has following equipment at home: None  OCCUPATION: IT works at computer   PLOF: [place]: to learn new way to  strengthen my back and prevent this from happening again in the future.  OBJECTIVE:   DIAGNOSTIC FINDINGS:  Has MRI scheduled    SCREENING FOR RED FLAGS: Bowel or bladder incontinence: No Spinal tumors: No Cauda equina syndrome: No Compression fracture: No Abdominal aneurysm: No  COGNITION: Overall cognitive status: Within functional limits for tasks assessed     SENSATION: WFL  MUSCLE LENGTH: Hamstrings: some tightness in leg muscles   POSTURE: No Significant postural limitations and rounded shoulders  LUMBAR ROM:   AROM eval  Flexion Can touch toes  Extension WFL  Right lateral flexion [facility]  Left lateral flexion Cramping on L side  Right rotation WFL  Left rotation WFL   (Blank rows = not tested)  LOWER EXTREMITY ROM:   grossly WFL    LOWER EXTREMITY MMT:  grossly 5/5   LUMBAR SPECIAL TESTS:  Straight leg raise test: Negative and Slump test: Negative  FUNCTIONAL TESTS:  30 seconds chair stand test 10 reps some back in L knee TODAY'S TREATMENT:                                                                                                                              DATE:   10/15/22 Elliptical L 2 fwd/2 min backward Green tband scap stab 4 way 15 x each  Red tband hip ext and abd 15 x each Issued above for HEP  [id number] Black tband trunk flex and ext 20x each Lat pull Down 35# 2 sets 12 Quadreped 20 x alt UE/LE Plank 20 sec 3 x- no pain but fatigue Feet on ball bridge 10 x, HS with lift 10x, obl 20 x, iso abdominals 10x  10/08/22 Ppt10x Ppt with green tband clam and hip flex 2 sets 10 each Ppt with SLR 10 x, then SLR with abd 10 x Above issued as additional HEP  [id number] Feet on ball bridge 10 x,HS curl with bridge 10 x,obl 20 x Isometric abdominals 15 x hold 3 sec Seated row 35# 2 sets 10 Lat pull 35# 2 sets 10 Black tband trunk ext 20 x Red tband hip ext and abd 10 BIL   10/01/22- EVAL   PATIENT EDUCATION:  Education details HEP   [email or web address] Person educated: Patient Education method: Explanation, demo ,HO Education comprehension: verbalized understanding and demo  HOME EXERCISE PROGRAM: Added  [email or web address] with green tband scap stab and hips Access Code: [email or web address] URL: [email or web address] Date: 10/01/2022 Prepared by: Cassie Freer  Exercises - Supine Bridge  - 1 x daily - 7 x weekly - 2 sets - 10 reps - Supine Dead Bug with Leg Extension  - 1 x daily - 7 x weekly - 2 sets - 10 reps - Full Superman on Table  - 1 x daily - 7 x weekly - 2 sets - 10 reps  ASSESSMENT:  CLINICAL IMPRESSION: progressed HEP. Progressed core ex with cuing. Assess goals   REHAB POTENTIAL: Good  CLINICAL DECISION MAKING: Stable/uncomplicated  EVALUATION COMPLEXITY: Low   GOALS: Goals reviewed with patient? Yes  SHORT TERM GOALS: Target date: 10/29/22  Patient will be independent with initial HEP.  Goal status: 10/08/22 MET   LONG TERM GOALS: Target date: 11/12/22  Patient will be independent with advanced/ongoing HEP to improve outcomes and carryover.  Goal status: INITIAL  2.  Patient will report no back pain in a 4 week period.  Baseline: 1/10 Goal status: on going 10/15/22  3.  Patient will demonstrate improved core strength with a 30s plank Goal status: INITIAL   PLAN:  PT FREQUENCY: 1x/week  PT DURATION: 6 weeks  PLANNED INTERVENTIONS: Therapeutic exercises, Therapeutic activity, Neuromuscular re-education, Balance training, Gait training, Patient/Family education, Self Care, Joint mobilization, Stair training, Electrical stimulation, Cryotherapy, Moist heat, Traction, and Manual therapy.  PLAN FOR NEXT SESSION: core and back strengthening, traction if any sciatica returns?   Mykah Bellomo,ANGIE, PTA 10/15/2022, 4:35 PM [place] [facility] [place]. [place], [place], [address] Phone: [phone]   Fax:  [phone]

## 2022-10-20 NOTE — Therapy (Signed)
OUTPATIENT PHYSICAL THERAPY THORACOLUMBAR    Patient Name: Steve Weeks MRN: [id number] DOB:Feb 08, 1976, 47 y.o., male Today's Date: 10/21/2022  END OF SESSION:  PT End of Session - 10/21/22 1707     Visit Number 4    Date for PT Re-Evaluation 11/12/22    Authorization Type BCBS    PT Start Time 1706    PT Stop Time 1746    PT Time Calculation (min) 40 min    Activity Tolerance Patient tolerated treatment well    Behavior During Therapy [facility] for tasks assessed/performed              No past medical history on file.  Patient Active Problem List   Diagnosis Date Noted   PALPITATIONS 03/18/2009   CHEST PAIN, LEFT 04/18/2008   HYPERTENSION, ESSENTIAL 03/02/2008   FOOT PAIN, LEFT 01/16/2008   HYPERLIPIDEMIA 11/05/2006   OBESITY 10/10/2006   ANXIETY 10/10/2006   DEPRESSION 10/10/2006   GERD 10/10/2006   LOW BACK PAIN, CHRONIC 10/10/2006   SLEEP APNEA 10/10/2006    PCP: Sharilyn Sites  REFERRING PROVIDER: Vickki Hearing  REFERRING DIAG: M54.50  Rationale for Evaluation and Treatment: Rehabilitation  THERAPY DIAG:  Muscle weakness (generalized)  Other low back pain  Radiculopathy, lumbar region  ONSET DATE: 08/15/2022  SUBJECTIVE:                                                                                                                                                                                           SUBJECTIVE STATEMENT: Saw my doctor and got MRI results showed some mild-moderate disc bulging and narrowing in my spine.    PERTINENT HISTORY:  HTN  PAIN:  Are you having pain? Yes: NPRS scale: 0/10 Pain location: low back Pain description: sore, achy Aggravating factors: sitting long hours Relieving factors: movement  PRECAUTIONS: None  WEIGHT BEARING RESTRICTIONS: No  FALLS:  Has patient fallen in last 6 months? No  LIVING ENVIRONMENT: Lives with: lives with their spouse Lives in: House/apartment Stairs: Yes: Internal: 12  steps; can reach both Has following equipment at home: None  OCCUPATION: IT works at computer   PLOF: [place]: to learn new way to strengthen my back and prevent this from happening again in the future.  OBJECTIVE:   DIAGNOSTIC FINDINGS:  MRI scanned in with results 10/21/22   SCREENING FOR RED FLAGS: Bowel or bladder incontinence: No Spinal tumors: No Cauda equina syndrome: No Compression fracture: No Abdominal aneurysm: No  COGNITION: Overall cognitive status: Within functional limits for tasks assessed     SENSATION: WFL  MUSCLE LENGTH: Hamstrings: some tightness in leg muscles  POSTURE: No Significant postural limitations and rounded shoulders  LUMBAR ROM:   AROM eval  Flexion Can touch toes  Extension WFL  Right lateral flexion WFL  Left lateral flexion Cramping on L side  Right rotation WFL  Left rotation WFL   (Blank rows = not tested)  LOWER EXTREMITY ROM:   grossly WFL    LOWER EXTREMITY MMT:  grossly 5/5   LUMBAR SPECIAL TESTS:  Straight leg raise test: Negative and Slump test: Negative  FUNCTIONAL TESTS:  30 seconds chair stand test 10 reps some back in L knee TODAY'S TREATMENT:                                                                                                                              DATE:  10/21/22 NuStep L5 x62mins Shoulder ext 10# 2x10 Cable rows 15# 2x10 Rows and Lats 35# 2x10 Deadbug 2x10 Plank on elbows 20s x3 AR press 20# 2x10 then circles x10 clockwise and counter clockwise on each side   10/15/22 Elliptical L 2 16min fwd/2 min backward Green tband scap stab 4 way 15 x each  Red tband hip ext and abd 15 x each Issued above for HEP  [id number] Black tband trunk flex and ext 20x each Lat pull Down 35# 2 sets 12 Quadreped 20 x alt UE/LE Plank 20 sec 3 x- no pain but fatigue Feet on ball bridge 10 x, HS with lift 10x, obl 20 x, iso abdominals 10x  10/08/22 Ppt10x Ppt with green tband clam and  hip flex 2 sets 10 each Ppt with SLR 10 x, then SLR with abd 10 x Above issued as additional HEP  [id number] Feet on ball bridge 10 x,HS curl with bridge 10 x,obl 20 x Isometric abdominals 15 x hold 3 sec Seated row 35# 2 sets 10 Lat pull 35# 2 sets 10 Black tband trunk ext 20 x Red tband hip ext and abd 10 BIL   10/01/22- EVAL   PATIENT EDUCATION:  Education details HEP  [email or web address] Person educated: Patient Education method: Explanation, demo ,HO Education comprehension: verbalized understanding and demo  HOME EXERCISE PROGRAM: Added  [email or web address] with green tband scap stab and hips Access Code: [email or web address] URL: [email or web address] Date: 10/01/2022 Prepared by: Andris Baumann  Exercises - Supine Bridge  - 1 x daily - 7 x weekly - 2 sets - 10 reps - Supine Dead Bug with Leg Extension  - 1 x daily - 7 x weekly - 2 sets - 10 reps - Full Superman on Table  - 1 x daily - 7 x weekly - 2 sets - 10 reps  ASSESSMENT:  CLINICAL IMPRESSION: Progressed core exercises with cueing. Tried some plank reach outs but unable to do. Does well with all other intervention, no c/o of pain just some fatigue.    REHAB POTENTIAL: Good  CLINICAL DECISION MAKING: Stable/uncomplicated  EVALUATION COMPLEXITY: Low   GOALS: Goals reviewed with patient? Yes  SHORT TERM GOALS:  Target date: 10/29/22  Patient will be independent with initial HEP.  Goal status: 10/08/22 MET   LONG TERM GOALS: Target date: 11/12/22  Patient will be independent with advanced/ongoing HEP to improve outcomes and carryover.  Goal status: INITIAL  2.  Patient will report no back pain in a 4 week period.  Baseline: 1/10 Goal status: on going 10/15/22  3.  Patient will demonstrate improved core strength with a 30s plank Goal status: INITIAL   PLAN:  PT FREQUENCY: 1x/week  PT DURATION: 6 weeks  PLANNED INTERVENTIONS: Therapeutic exercises, Therapeutic activity, Neuromuscular re-education, Balance training, Gait  training, Patient/Family education, Self Care, Joint mobilization, Stair training, Electrical stimulation, Cryotherapy, Moist heat, Traction, and Manual therapy.  PLAN FOR NEXT SESSION: continue with core and back strengthening, maybe traction to help with disc bulging   Andris Baumann, PT 10/21/2022, 5:48 PM [place] at [place]. [place], [place], [address] Phone: [phone]   Fax:  [phone]

## 2022-10-21 ENCOUNTER — Ambulatory Visit: Payer: BC Managed Care – PPO

## 2022-10-21 DIAGNOSIS — M6281 Muscle weakness (generalized): Secondary | ICD-10-CM

## 2022-10-21 DIAGNOSIS — M5416 Radiculopathy, lumbar region: Secondary | ICD-10-CM

## 2022-10-21 DIAGNOSIS — M5459 Other low back pain: Secondary | ICD-10-CM

## 2022-10-27 ENCOUNTER — Other Ambulatory Visit (HOSPITAL_BASED_OUTPATIENT_CLINIC_OR_DEPARTMENT_OTHER): Payer: Self-pay

## 2022-10-27 MED ORDER — WEGOVY 1.7 MG/0.75ML ~~LOC~~ SOAJ
1.7000 mg | SUBCUTANEOUS | 0 refills | Status: DC
  Filled 2022-10-27 – 2022-11-03 (×3): qty 3, 28d supply, fill #0

## 2022-10-28 ENCOUNTER — Other Ambulatory Visit (HOSPITAL_BASED_OUTPATIENT_CLINIC_OR_DEPARTMENT_OTHER): Payer: Self-pay

## 2022-10-28 NOTE — Therapy (Signed)
OUTPATIENT PHYSICAL THERAPY THORACOLUMBAR    Patient Name: Steve Weeks MRN: [id number] DOB:03-28-1976, 47 y.o., male Today's Date: 10/29/2022  END OF SESSION:  PT End of Session - 10/29/22 1631     Visit Number 5    Date for PT Re-Evaluation 11/12/22    Authorization Type BCBS    PT Start Time 1630    PT Stop Time 1715    PT Time Calculation (min) 45 min    Activity Tolerance Patient tolerated treatment well    Behavior During Therapy [facility] for tasks assessed/performed               History reviewed. No pertinent past medical history.  Patient Active Problem List   Diagnosis Date Noted   PALPITATIONS 03/18/2009   CHEST PAIN, LEFT 04/18/2008   HYPERTENSION, ESSENTIAL 03/02/2008   FOOT PAIN, LEFT 01/16/2008   HYPERLIPIDEMIA 11/05/2006   OBESITY 10/10/2006   ANXIETY 10/10/2006   DEPRESSION 10/10/2006   GERD 10/10/2006   LOW BACK PAIN, CHRONIC 10/10/2006   SLEEP APNEA 10/10/2006    PCP: Sharilyn Sites  REFERRING PROVIDER: Vickki Hearing  REFERRING DIAG: M54.50  Rationale for Evaluation and Treatment: Rehabilitation  THERAPY DIAG:  Muscle weakness (generalized)  Other low back pain  ONSET DATE: 08/15/2022  SUBJECTIVE:                                                                                                                                                                                           SUBJECTIVE STATEMENT: Doing pretty good, back feels okay have no had any occurences of pain so far.    PERTINENT HISTORY:  HTN  PAIN:  Are you having pain? Yes: NPRS scale: 0/10 Pain location: low back Pain description: sore, achy Aggravating factors: sitting long hours Relieving factors: movement  PRECAUTIONS: None  WEIGHT BEARING RESTRICTIONS: No  FALLS:  Has patient fallen in last 6 months? No  LIVING ENVIRONMENT: Lives with: lives with their spouse Lives in: House/apartment Stairs: Yes: Internal: 12 steps; can reach both Has following  equipment at home: None  OCCUPATION: IT works at computer   PLOF: [place]: to learn new way to strengthen my back and prevent this from happening again in the future.  OBJECTIVE:   DIAGNOSTIC FINDINGS:  MRI scanned in with results 10/21/22   SCREENING FOR RED FLAGS: Bowel or bladder incontinence: No Spinal tumors: No Cauda equina syndrome: No Compression fracture: No Abdominal aneurysm: No  COGNITION: Overall cognitive status: Within functional limits for tasks assessed     SENSATION: WFL  MUSCLE LENGTH: Hamstrings: some tightness in leg muscles   POSTURE: No  Significant postural limitations and rounded shoulders  LUMBAR ROM:   AROM eval  Flexion Can touch toes  Extension WFL  Right lateral flexion WFL  Left lateral flexion Cramping on L side  Right rotation WFL  Left rotation WFL   (Blank rows = not tested)  LOWER EXTREMITY ROM:   grossly WFL    LOWER EXTREMITY MMT:  grossly 5/5   LUMBAR SPECIAL TESTS:  Straight leg raise test: Negative and Slump test: Negative  FUNCTIONAL TESTS:  30 seconds chair stand test 10 reps some back in L knee TODAY'S TREATMENT:                                                                                                                              DATE:  10/29/22 Elliptical L2 28mins forwards and the backwards BlackTB crunches 2x10 BlackTB ext 2x10 Rows and lats 35# 2x10 Plank up and down 2x5 Plank holds 20s x3  Modified sit ups with yellow ball 2x10 [place] twists with yellow ball 2x10  Shoulder extension 15# 2x10  10/21/22 NuStep L5 x43mins Shoulder ext 10# 2x10 Cable rows 15# 2x10 Rows and Lats 35# 2x10 Deadbug 2x10 Plank on elbows 20s x3 AR press 20# 2x10 then circles x10 clockwise and counter clockwise on each side   10/15/22 Elliptical L 2 42min fwd/2 min backward Green tband scap stab 4 way 15 x each  Red tband hip ext and abd 15 x each Issued above for HEP  [id number] Black tband trunk  flex and ext 20x each Lat pull Down 35# 2 sets 12 Quadreped 20 x alt UE/LE Plank 20 sec 3 x- no pain but fatigue Feet on ball bridge 10 x, HS with lift 10x, obl 20 x, iso abdominals 10x  10/08/22 Ppt10x Ppt with green tband clam and hip flex 2 sets 10 each Ppt with SLR 10 x, then SLR with abd 10 x Above issued as additional HEP  [id number] Feet on ball bridge 10 x,HS curl with bridge 10 x,obl 20 x Isometric abdominals 15 x hold 3 sec Seated row 35# 2 sets 10 Lat pull 35# 2 sets 10 Black tband trunk ext 20 x Red tband hip ext and abd 10 BIL   10/01/22- EVAL   PATIENT EDUCATION:  Education details HEP  [email or web address] Person educated: Patient Education method: Explanation, demo ,HO Education comprehension: verbalized understanding and demo  HOME EXERCISE PROGRAM: Added  [email or web address] with green tband scap stab and hips Access Code: [email or web address] URL: [email or web address] Date: 10/01/2022 Prepared by: Andris Baumann  Exercises - Supine Bridge  - 1 x daily - 7 x weekly - 2 sets - 10 reps - Supine Dead Bug with Leg Extension  - 1 x daily - 7 x weekly - 2 sets - 10 reps - Full Superman on Table  - 1 x daily - 7 x weekly - 2 sets - 10 reps  ASSESSMENT:  CLINICAL IMPRESSION: Progressed core exercises with cueing. Most difficulty with  plank up and downs. Tried to do sit ups but he was unable to do them, could be because of fatigue towards end of session. No c/o of pain just some fatigue. Has joined a new gym, will be ready for d/c in the next couple of visits.   REHAB POTENTIAL: Good  CLINICAL DECISION MAKING: Stable/uncomplicated  EVALUATION COMPLEXITY: Low   GOALS: Goals reviewed with patient? Yes  SHORT TERM GOALS: Target date: 10/29/22  Patient will be independent with initial HEP.  Goal status: 10/08/22 MET   LONG TERM GOALS: Target date: 11/12/22  Patient will be independent with advanced/ongoing HEP to improve outcomes and carryover.  Goal status: INITIAL  2.   Patient will report no back pain in a 4 week period.  Baseline: 1/10 Goal status: on going 10/15/22  3.  Patient will demonstrate improved core strength with a 30s plank Goal status: INITIAL   PLAN:  PT FREQUENCY: 1x/week  PT DURATION: 6 weeks  PLANNED INTERVENTIONS: Therapeutic exercises, Therapeutic activity, Neuromuscular re-education, Balance training, Gait training, Patient/Family education, Self Care, Joint mobilization, Stair training, Electrical stimulation, Cryotherapy, Moist heat, Traction, and Manual therapy.  PLAN FOR NEXT SESSION: continue with core and back strengthening, maybe traction to help with disc bulging   Andris Baumann, PT 10/29/2022, 5:15 PM [place] at [place]. [place], [place], [address] Phone: [phone]   Fax:  [phone]

## 2022-10-29 ENCOUNTER — Ambulatory Visit: Payer: BC Managed Care – PPO | Attending: Sports Medicine

## 2022-10-29 DIAGNOSIS — M5459 Other low back pain: Secondary | ICD-10-CM | POA: Insufficient documentation

## 2022-10-29 DIAGNOSIS — M6281 Muscle weakness (generalized): Secondary | ICD-10-CM | POA: Diagnosis present

## 2022-11-03 ENCOUNTER — Other Ambulatory Visit: Payer: Self-pay

## 2022-11-03 ENCOUNTER — Other Ambulatory Visit (HOSPITAL_BASED_OUTPATIENT_CLINIC_OR_DEPARTMENT_OTHER): Payer: Self-pay

## 2022-11-16 ENCOUNTER — Ambulatory Visit: Payer: BC Managed Care – PPO

## 2022-11-16 DIAGNOSIS — M6281 Muscle weakness (generalized): Secondary | ICD-10-CM

## 2022-11-16 NOTE — Therapy (Signed)
OUTPATIENT PHYSICAL THERAPY THORACOLUMBAR    Patient Name: Steve Weeks MRN: [id number] DOB:1976-08-18, 47 y.o., male Today's Date: 10/29/2022  END OF SESSION:  PT End of Session - 10/29/22 1631     Visit Number 5    Date for PT Re-Evaluation 11/12/22    Authorization Type BCBS    PT Start Time 1630    PT Stop Time 1715    PT Time Calculation (min) 45 min    Activity Tolerance Patient tolerated treatment well    Behavior During Therapy [facility] for tasks assessed/performed               History reviewed. No pertinent past medical history.  Patient Active Problem List   Diagnosis Date Noted   PALPITATIONS 03/18/2009   CHEST PAIN, LEFT 04/18/2008   HYPERTENSION, ESSENTIAL 03/02/2008   FOOT PAIN, LEFT 01/16/2008   HYPERLIPIDEMIA 11/05/2006   OBESITY 10/10/2006   ANXIETY 10/10/2006   DEPRESSION 10/10/2006   GERD 10/10/2006   LOW BACK PAIN, CHRONIC 10/10/2006   SLEEP APNEA 10/10/2006    PCP: Sharilyn Sites  REFERRING PROVIDER: Vickki Hearing  REFERRING DIAG: M54.50  Rationale for Evaluation and Treatment: Rehabilitation  THERAPY DIAG:  Muscle weakness (generalized)  Other low back pain  ONSET DATE: 08/15/2022  SUBJECTIVE:                                                                                                                                                                                           SUBJECTIVE STATEMENT: Doing more than I have been doing and it has been holding up pretty well   PERTINENT HISTORY:  HTN  PAIN:  Are you having pain? Yes: NPRS scale: 0/10 Pain location: low back Pain description: sore, achy Aggravating factors: sitting long hours Relieving factors: movement  PRECAUTIONS: None  WEIGHT BEARING RESTRICTIONS: No  FALLS:  Has patient fallen in last 6 months? No  LIVING ENVIRONMENT: Lives with: lives with their spouse Lives in: House/apartment Stairs: Yes: Internal: 12 steps; can reach both Has following  equipment at home: None  OCCUPATION: IT works at computer   PLOF: [place]: to learn new way to strengthen my back and prevent this from happening again in the future.  OBJECTIVE:   DIAGNOSTIC FINDINGS:  MRI scanned in with results 10/21/22   SCREENING FOR RED FLAGS: Bowel or bladder incontinence: No Spinal tumors: No Cauda equina syndrome: No Compression fracture: No Abdominal aneurysm: No  COGNITION: Overall cognitive status: Within functional limits for tasks assessed     SENSATION: WFL  MUSCLE LENGTH: Hamstrings: some tightness in leg muscles   POSTURE: No Significant  postural limitations and rounded shoulders  LUMBAR ROM:   AROM eval  Flexion Can touch toes  Extension WFL  Right lateral flexion WFL  Left lateral flexion Cramping on L side  Right rotation WFL  Left rotation WFL   (Blank rows = not tested)  LOWER EXTREMITY ROM:   grossly WFL    LOWER EXTREMITY MMT:  grossly 5/5   LUMBAR SPECIAL TESTS:  Straight leg raise test: Negative and Slump test: Negative  FUNCTIONAL TESTS:  30 seconds chair stand test 10 reps some back in L knee TODAY'S TREATMENT:                                                                                                                              DATE:  11/16/22 NuStep L6 x59mns  Seated rows and lats 45# 2x10 Elbow to knee 2x10  Deadbugs 2x10  Modified in and out 4x5 Planks 30s x3  AR press 20# 2x10    10/29/22 Elliptical L2 225ms forwards and the backwards BlackTB crunches 2x10 BlackTB ext 2x10 Rows and lats 35# 2x10 Plank up and down 2x5 Plank holds 20s x3  Modified sit ups with yellow ball 2x10 Ru[place]wists with yellow ball 2x10  Shoulder extension 15# 2x10  10/21/22 NuStep L5 x6m67m Shoulder ext 10# 2x10 Cable rows 15# 2x10 Rows and Lats 35# 2x10 Deadbug 2x10 Plank on elbows 20s x3 AR press 20# 2x10 then circles x10 clockwise and counter clockwise on each  side   10/15/22 Elliptical L 2 2mi83mwd/2 min backward Green tband scap stab 4 way 15 x each  Red tband hip ext and abd 15 x each Issued above for HEP  [id number] Black tband trunk flex and ext 20x each Lat pull Down 35# 2 sets 12 Quadreped 20 x alt UE/LE Plank 20 sec 3 x- no pain but fatigue Feet on ball bridge 10 x, HS with lift 10x, obl 20 x, iso abdominals 10x  10/08/22 Ppt10x Ppt with green tband clam and hip flex 2 sets 10 each Ppt with SLR 10 x, then SLR with abd 10 x Above issued as additional HEP  [id number] Feet on ball bridge 10 x,HS curl with bridge 10 x,obl 20 x Isometric abdominals 15 x hold 3 sec Seated row 35# 2 sets 10 Lat pull 35# 2 sets 10 Black tband trunk ext 20 x Red tband hip ext and abd 10 BIL   10/01/22- EVAL   PATIENT EDUCATION:  Education details HEP  [id number] educated: Patient Education method: Explanation, demo ,HO Education comprehension: verbalized understanding and demo  HOME EXERCISE PROGRAM: Added  [id number] with green tband scap stab and hips Access Code: [email or web address]: [email or web address] Date: 10/01/2022 Prepared by: MonaAndris Baumannercises - Supine Bridge  - 1 x daily - 7 x weekly - 2 sets - 10 reps - Supine Dead Bug with Leg Extension  - 1 x daily - 7 x weekly - 2 sets - 10  reps - Full Superman on Table  - 1 x daily - 7 x weekly - 2 sets - 10 reps  ASSESSMENT:  CLINICAL IMPRESSION: patient continues to report no pain in low back. Continues with progressive core strengthening and exercises. Difficulty with flutter kicks, weakness still in low abdominals. Has met his goals and will continue HEP and independent gym program.    REHAB POTENTIAL: Good  CLINICAL DECISION MAKING: Stable/uncomplicated  EVALUATION COMPLEXITY: Low   GOALS: Goals reviewed with patient? Yes  SHORT TERM GOALS: Target date: 10/29/22  Patient will be independent with initial HEP.  Goal status: 10/08/22 MET   LONG TERM GOALS: Target  date: 11/12/22  Patient will be independent with advanced/ongoing HEP to improve outcomes and carryover.  Goal status: MET  2.  Patient will report no back pain in a 4 week period.  Baseline: 1/10 Goal status: on going 10/15/22, MET   3.  Patient will demonstrate improved core strength with a 30s plank Goal status: MET   PLAN:  PT FREQUENCY: 1x/week  PT DURATION: 6 weeks  PLANNED INTERVENTIONS: Therapeutic exercises, Therapeutic activity, Neuromuscular re-education, Balance training, Gait training, Patient/Family education, Self Care, Joint mobilization, Stair training, Electrical stimulation, Cryotherapy, Moist heat, Traction, and Manual therapy.  PLAN FOR NEXT SESSION: d/c   PHYSICAL THERAPY DISCHARGE SUMMARY  Visits from Start of Care: 6  Patient agrees to discharge. Patient goals were met. Patient is being discharged due to meeting the stated rehab goals.   Andris Baumann, PT 10/29/2022, 5:15 PM [place] [facility] [place]. [place], [place], [address] Phone: [phone]   Fax:  [phone]

## 2022-11-19 ENCOUNTER — Other Ambulatory Visit (HOSPITAL_BASED_OUTPATIENT_CLINIC_OR_DEPARTMENT_OTHER): Payer: Self-pay

## 2022-11-19 MED ORDER — WEGOVY 1.7 MG/0.75ML ~~LOC~~ SOAJ
1.7000 mg | SUBCUTANEOUS | 1 refills | Status: AC
  Filled 2022-11-19: qty 3, 28d supply, fill #0

## 2022-11-20 ENCOUNTER — Other Ambulatory Visit (HOSPITAL_BASED_OUTPATIENT_CLINIC_OR_DEPARTMENT_OTHER): Payer: Self-pay

## 2022-11-20 ENCOUNTER — Encounter (HOSPITAL_BASED_OUTPATIENT_CLINIC_OR_DEPARTMENT_OTHER): Payer: Self-pay

## 2022-12-03 ENCOUNTER — Other Ambulatory Visit (HOSPITAL_BASED_OUTPATIENT_CLINIC_OR_DEPARTMENT_OTHER): Payer: Self-pay

## 2022-12-21 ENCOUNTER — Other Ambulatory Visit (HOSPITAL_BASED_OUTPATIENT_CLINIC_OR_DEPARTMENT_OTHER): Payer: Self-pay

## 2022-12-21 MED ORDER — WEGOVY 1.7 MG/0.75ML ~~LOC~~ SOAJ
1.7000 mg | SUBCUTANEOUS | 1 refills | Status: AC
  Filled 2022-12-21 – 2023-01-01 (×2): qty 3, 28d supply, fill #0

## 2023-01-01 ENCOUNTER — Other Ambulatory Visit (HOSPITAL_BASED_OUTPATIENT_CLINIC_OR_DEPARTMENT_OTHER): Payer: Self-pay

## 2023-01-18 ENCOUNTER — Other Ambulatory Visit (HOSPITAL_BASED_OUTPATIENT_CLINIC_OR_DEPARTMENT_OTHER): Payer: Self-pay

## 2023-01-18 MED ORDER — WEGOVY 2.4 MG/0.75ML ~~LOC~~ SOAJ
2.4000 mg | SUBCUTANEOUS | 0 refills | Status: DC
  Filled 2023-01-18 – 2023-01-27 (×2): qty 3, 28d supply, fill #0

## 2023-01-27 ENCOUNTER — Other Ambulatory Visit (HOSPITAL_BASED_OUTPATIENT_CLINIC_OR_DEPARTMENT_OTHER): Payer: Self-pay

## 2023-02-18 ENCOUNTER — Other Ambulatory Visit (HOSPITAL_BASED_OUTPATIENT_CLINIC_OR_DEPARTMENT_OTHER): Payer: Self-pay

## 2023-02-18 MED ORDER — WEGOVY 2.4 MG/0.75ML ~~LOC~~ SOAJ
2.4000 mg | SUBCUTANEOUS | 2 refills | Status: AC
  Filled 2023-02-18: qty 3, 28d supply, fill #0
  Filled 2023-03-20: qty 3, 28d supply, fill #1
  Filled 2023-04-20: qty 3, 28d supply, fill #2

## 2023-04-21 ENCOUNTER — Other Ambulatory Visit (HOSPITAL_BASED_OUTPATIENT_CLINIC_OR_DEPARTMENT_OTHER): Payer: Self-pay

## 2023-04-22 ENCOUNTER — Other Ambulatory Visit (HOSPITAL_BASED_OUTPATIENT_CLINIC_OR_DEPARTMENT_OTHER): Payer: Self-pay

## 2023-04-25 ENCOUNTER — Other Ambulatory Visit (HOSPITAL_BASED_OUTPATIENT_CLINIC_OR_DEPARTMENT_OTHER): Payer: Self-pay

## 2023-04-27 ENCOUNTER — Other Ambulatory Visit: Payer: Self-pay

## 2023-04-28 ENCOUNTER — Other Ambulatory Visit (HOSPITAL_BASED_OUTPATIENT_CLINIC_OR_DEPARTMENT_OTHER): Payer: Self-pay

## 2023-05-02 ENCOUNTER — Other Ambulatory Visit (HOSPITAL_BASED_OUTPATIENT_CLINIC_OR_DEPARTMENT_OTHER): Payer: Self-pay

## 2023-05-05 ENCOUNTER — Other Ambulatory Visit (HOSPITAL_BASED_OUTPATIENT_CLINIC_OR_DEPARTMENT_OTHER): Payer: Self-pay

## 2023-05-07 ENCOUNTER — Other Ambulatory Visit (HOSPITAL_BASED_OUTPATIENT_CLINIC_OR_DEPARTMENT_OTHER): Payer: Self-pay

## 2023-05-11 ENCOUNTER — Other Ambulatory Visit (HOSPITAL_BASED_OUTPATIENT_CLINIC_OR_DEPARTMENT_OTHER): Payer: Self-pay

## 2023-05-11 MED ORDER — WEGOVY 2.4 MG/0.75ML ~~LOC~~ SOAJ
2.4000 mg | SUBCUTANEOUS | 2 refills | Status: AC
  Filled 2023-05-11: qty 3, 28d supply, fill #0
  Filled 2023-06-07: qty 3, 30d supply, fill #0
  Filled 2023-06-29: qty 3, 30d supply, fill #1
  Filled 2023-08-25: qty 3, 30d supply, fill #2

## 2023-06-07 ENCOUNTER — Other Ambulatory Visit (HOSPITAL_BASED_OUTPATIENT_CLINIC_OR_DEPARTMENT_OTHER): Payer: Self-pay

## 2023-07-30 ENCOUNTER — Other Ambulatory Visit (HOSPITAL_BASED_OUTPATIENT_CLINIC_OR_DEPARTMENT_OTHER): Payer: Self-pay

## 2023-07-30 MED ORDER — WEGOVY 2.4 MG/0.75ML ~~LOC~~ SOAJ
2.4000 mg | SUBCUTANEOUS | 2 refills | Status: DC
  Filled 2023-07-30: qty 3, 28d supply, fill #0
  Filled 2023-10-03: qty 3, 28d supply, fill #1
  Filled 2023-10-27: qty 3, 28d supply, fill #2

## 2023-11-03 ENCOUNTER — Other Ambulatory Visit (HOSPITAL_BASED_OUTPATIENT_CLINIC_OR_DEPARTMENT_OTHER): Payer: Self-pay

## 2023-11-03 MED ORDER — WEGOVY 2.4 MG/0.75ML ~~LOC~~ SOAJ
2.4000 mg | SUBCUTANEOUS | 2 refills | Status: DC
Start: 2023-11-03 — End: 2024-02-10
  Filled 2023-11-03: qty 3, 28d supply, fill #0
  Filled 2023-11-28: qty 3, 30d supply, fill #0
  Filled 2023-12-24: qty 3, 30d supply, fill #1
  Filled 2024-01-20: qty 3, 30d supply, fill #2

## 2023-11-29 ENCOUNTER — Other Ambulatory Visit (HOSPITAL_BASED_OUTPATIENT_CLINIC_OR_DEPARTMENT_OTHER): Payer: Self-pay

## 2024-02-10 ENCOUNTER — Other Ambulatory Visit (HOSPITAL_BASED_OUTPATIENT_CLINIC_OR_DEPARTMENT_OTHER): Payer: Self-pay

## 2024-02-10 MED ORDER — WEGOVY 2.4 MG/0.75ML ~~LOC~~ SOAJ
2.4000 mg | SUBCUTANEOUS | 2 refills | Status: DC
Start: 2024-02-10 — End: 2024-05-17
  Filled 2024-02-10 – 2024-02-11 (×2): qty 3, 28d supply, fill #0
  Filled 2024-03-09: qty 3, 28d supply, fill #1
  Filled 2024-04-06: qty 3, 28d supply, fill #2

## 2024-02-11 ENCOUNTER — Other Ambulatory Visit (HOSPITAL_BASED_OUTPATIENT_CLINIC_OR_DEPARTMENT_OTHER): Payer: Self-pay

## 2024-05-17 ENCOUNTER — Other Ambulatory Visit (HOSPITAL_BASED_OUTPATIENT_CLINIC_OR_DEPARTMENT_OTHER): Payer: Self-pay

## 2024-05-17 MED ORDER — WEGOVY 2.4 MG/0.75ML ~~LOC~~ SOAJ
2.4000 mg | SUBCUTANEOUS | 2 refills | Status: DC
  Filled 2024-05-17: qty 3, 28d supply, fill #0
  Filled 2024-06-15: qty 3, 28d supply, fill #1
  Filled 2024-07-13: qty 3, 28d supply, fill #2

## 2024-05-18 ENCOUNTER — Other Ambulatory Visit (HOSPITAL_BASED_OUTPATIENT_CLINIC_OR_DEPARTMENT_OTHER): Payer: Self-pay

## 2024-05-20 ENCOUNTER — Other Ambulatory Visit (HOSPITAL_BASED_OUTPATIENT_CLINIC_OR_DEPARTMENT_OTHER): Payer: Self-pay

## 2024-08-28 ENCOUNTER — Other Ambulatory Visit (HOSPITAL_BASED_OUTPATIENT_CLINIC_OR_DEPARTMENT_OTHER): Payer: Self-pay

## 2024-08-28 MED ORDER — WEGOVY 2.4 MG/0.75ML ~~LOC~~ SOAJ
SUBCUTANEOUS | 2 refills | Status: AC
  Filled 2024-08-28: qty 3, 28d supply, fill #0
  Filled 2024-09-22: qty 3, 28d supply, fill #1

## 2024-09-12 ENCOUNTER — Ambulatory Visit

## 2024-09-12 DIAGNOSIS — S83282A Other tear of lateral meniscus, current injury, left knee, initial encounter: Secondary | ICD-10-CM | POA: Insufficient documentation

## 2024-09-12 DIAGNOSIS — M25562 Pain in left knee: Secondary | ICD-10-CM | POA: Insufficient documentation

## 2024-09-12 NOTE — Therapy (Signed)
 " OUTPATIENT PHYSICAL THERAPY LOWER EXTREMITY EVALUATION   Patient Name: Steve Weeks MRN: [id number] DOB:1976/04/10, 48 y.o., male Today's Date: 09/12/2024  END OF SESSION:  PT End of Session - 09/12/24 1721     Visit Number 1    Date for Recertification  11/21/24    Authorization Type BCBS    PT Start Time 1630    PT Stop Time 1705    PT Time Calculation (min) 35 min    Activity Tolerance Patient tolerated treatment well    Behavior During Therapy [facility] for tasks assessed/performed         History reviewed. No pertinent past medical history. History reviewed. No pertinent surgical history. Patient Active Problem List   Diagnosis Date Noted   PALPITATIONS 03/18/2009   CHEST PAIN, LEFT 04/18/2008   Essential hypertension 03/02/2008   FOOT PAIN, LEFT 01/16/2008   HYPERLIPIDEMIA 11/05/2006   OBESITY 10/10/2006   Anxiety state 10/10/2006   DEPRESSION 10/10/2006   GERD 10/10/2006   LOW BACK PAIN, CHRONIC 10/10/2006   Sleep apnea 10/10/2006    PCP: Carol Catalan, PA-C  REFERRING PROVIDER: Arnaldo Juliene RAMAN, MD  REFERRING DIAG: M25.562 Pain in L knee   THERAPY DIAG:  Acute pain of left knee  Acute lateral meniscus tear of left knee, initial encounter  Knee meniscus pain, left  Rationale for Evaluation and Treatment: Rehabilitation  ONSET DATE:   SUBJECTIVE:   SUBJECTIVE STATEMENT: Pt stated his desire to avoid surgery for small tear of his left meniscus with PT. Pt reports he actively goes to the gym and has a trainer while he also participates in the sport of Curling. Pt states he is consistently active and would like to progress to be able to jog on the treadmill again.    PERTINENT HISTORY: See PMH chart above  PAIN:  Are you having pain? Yes: NPRS scale: 2-3/10 Pain location: L knee Pain description: discomfort  Aggravating factors: Prolonged activity involving increased knee flexion  Relieving factors: Rest   PRECAUTIONS: Deep  squatting/Lunging   RED FLAGS: None   WEIGHT BEARING RESTRICTIONS: No  FALLS:  Has patient fallen in last 6 months? No  LIVING ENVIRONMENT: Lives with: lives with their family Lives in: House/apartment Stairs: Yes: Internal: standard staircase 12-15 steps; on right going up Has following equipment at home: None  OCCUPATION: Desk Job   PLOF: Independent  PATIENT GOALS: To return to curling with no modifications and play pain free. Be able to jog again on the treadmill.   NEXT MD VISIT: Unknown   OBJECTIVE:  Note: Objective measures were completed at Evaluation unless otherwise noted.  DIAGNOSTIC FINDINGS: Reported LLE meniscal tear in chart and by pt; Imaging likely occurred outside of [facility] facility; ask upon next visit    COGNITION: Overall cognitive status: Within functional limits for tasks assessed     SENSATION: WFL    LOWER EXTREMITY ROM:  WNL  LOWER EXTREMITY MMT:  WNL   GAIT: Distance walked: In clinic  Assistive device utilized: None Level of assistance: Complete Independence Comments: No significant gait deficits noted  TREATMENT DATE: 09/12/24- Eval    PATIENT EDUCATION:  Education details: HEP Person educated: Patient Education method: Medical Illustrator Education comprehension: verbalized understanding and returned demonstration  HOME EXERCISE PROGRAM:  Access Code: [email or web address] Date: 09/12/2024 Prepared by: Almetta Fam Exercises - Wall Quarter Squat  - 1 x daily - 7 x weekly - 2 sets - 10 reps - Supine Active Straight Leg Raise  - 1 x daily - 7 x weekly - 2 sets - 10 reps - Seated Knee Extension with Resistance  - 1 x daily - 7 x weekly - 2 sets - 10 reps - Seated Hamstring Curls with Resistance  - 1 x daily - 7 x weekly - 2 sets - 10 reps - Heel Raise on Step  - 1 x daily - 7 x weekly - 2 sets - 10 reps    ASSESSMENT:  CLINICAL IMPRESSION: Patient is a 48 y.o. male who was seen today for physical therapy evaluation and treatment for L pain via LLE meniscus tear. 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 Pt participates in a Curling league; season ends in January. Pt exhibits good movement quality but faces deficits in LLE knee in movements of deep squatting/lunging due to increased stress it put onto the meniscus. Pt's torn meniscus has cause pain in LLE ROM and functional movement which presents as decreased capacity and endurance for high level activity. Pt will benefit from target exercise to strengthen musculature surrounding the knee joint and stretches to relieve pressure surrounding the knee joint. PT focused on decreasing knee pain and increasing joint stability.   OBJECTIVE IMPAIRMENTS: decreased activity tolerance, decreased endurance, and pain.   ACTIVITY LIMITATIONS: lifting and squatting  PARTICIPATION LIMITATIONS: Gym activity, Curling   PERSONAL FACTORS: 1-2 comorbidities:   are also affecting patient's functional outcome.   REHAB POTENTIAL: Good  CLINICAL DECISION MAKING: Stable/uncomplicated  EVALUATION COMPLEXITY: Low   GOALS: Goals reviewed with patient? Yes  SHORT TERM GOALS: Target date: 10/17/24  Patient will be independent with initial HEP. Baseline:  Goal status: INITIAL   LONG TERM GOALS: Target date: 11/21/24  Patient will be independent with advanced/ongoing HEP to improve outcomes and carryover.  Baseline:  Goal status: INITIAL  2.  Pt will be able to jog on treadmill again without pain Baseline:  Goal status: INITIAL  3. Patient will be able to ascend/descend stairs with no HR and reciprocal step pattern safely and without pain to access home and community.  Baseline:  Goal status: INITIAL  4. Pt will be able to perform all knee ROM without pain 0/10  NPS  Baseline: 3/10  Goal Status: Initial   5. Pt will be able to perform a deep squat and lunge without pain in order to return to PLOF gym routine and perform movements for participation in Curling.  Baseline:  Goal Status: INITIAL     PLAN:  PT FREQUENCY: 1x/week  PT DURATION: 10 weeks  PLANNED INTERVENTIONS: 97110-Therapeutic exercises, 97530- Therapeutic activity, [id number]- Neuromuscular re-education, 97535- Self Care, [id number]- Manual therapy, [id number]- Gait training, [phone]- Ionotophoresis 4mg /ml Dexamethasone, and Patient/Family education  PLAN FOR NEXT SESSION: Light strengthening of musculature surrounding LLE knee, stretches, Ice if swelling noticeable. Avoid movements that increase stress to meniscus (deep squats).   Thersia Alder, Student-PT 09/12/2024, 5:56 PM  "

## 2024-09-12 NOTE — Addendum Note (Signed)
 Addended by: JANIT FORMICA on: 09/12/2024 06:09 PM   Modules accepted: Orders

## 2024-09-25 ENCOUNTER — Encounter: Payer: Self-pay | Admitting: Physical Therapy

## 2024-09-25 ENCOUNTER — Ambulatory Visit: Attending: Physician Assistant | Admitting: Physical Therapy

## 2024-09-25 DIAGNOSIS — M5416 Radiculopathy, lumbar region: Secondary | ICD-10-CM | POA: Insufficient documentation

## 2024-09-25 DIAGNOSIS — M6281 Muscle weakness (generalized): Secondary | ICD-10-CM | POA: Diagnosis present

## 2024-09-25 DIAGNOSIS — M25562 Pain in left knee: Secondary | ICD-10-CM | POA: Diagnosis present

## 2024-09-25 DIAGNOSIS — S83282A Other tear of lateral meniscus, current injury, left knee, initial encounter: Secondary | ICD-10-CM | POA: Insufficient documentation

## 2024-09-25 DIAGNOSIS — M5459 Other low back pain: Secondary | ICD-10-CM | POA: Insufficient documentation

## 2024-09-25 NOTE — Therapy (Signed)
 " OUTPATIENT PHYSICAL THERAPY LOWER EXTREMITY TREATMENT    Patient Name: Steve Weeks MRN: [id number] DOB:March 25, 1976, 49 y.o., male Today's Date: 09/25/2024  END OF SESSION:  PT End of Session - 09/25/24 1425     Visit Number 2    Date for Recertification  11/21/24    Authorization Type BCBS    PT Start Time 1403    PT Stop Time 1441    PT Time Calculation (min) 38 min    Activity Tolerance Patient tolerated treatment well    Behavior During Therapy [facility] for tasks assessed/performed          History reviewed. No pertinent past medical history. History reviewed. No pertinent surgical history. Patient Active Problem List   Diagnosis Date Noted   PALPITATIONS 03/18/2009   CHEST PAIN, LEFT 04/18/2008   Essential hypertension 03/02/2008   FOOT PAIN, LEFT 01/16/2008   HYPERLIPIDEMIA 11/05/2006   OBESITY 10/10/2006   Anxiety state 10/10/2006   DEPRESSION 10/10/2006   GERD 10/10/2006   LOW BACK PAIN, CHRONIC 10/10/2006   Sleep apnea 10/10/2006    PCP: Carol Catalan, PA-C  REFERRING PROVIDER: Arnaldo Juliene RAMAN, MD  REFERRING DIAG: M25.562 Pain in L knee   THERAPY DIAG:  Acute pain of left knee  Acute lateral meniscus tear of left knee, initial encounter  Muscle weakness (generalized)  Knee meniscus pain, left  Rationale for Evaluation and Treatment: Rehabilitation  ONSET DATE:   SUBJECTIVE:   SUBJECTIVE STATEMENT:   Things are going well since the eval. HEP has been going well, having been working with my trainer as well and he has been adjusting squat/lunge depth. Knee has been feeling good the past few days. Trying to support all my weight on the leg when its bent is probably the hardest thing right now, getting some sharp pains from this. Going to a curling tournament but not playing, cheering wife on. New season starts in 2-3 weeks, will try stick curling so I don't have to squat as much.     Eval: Pt stated his desire to avoid surgery for small  tear of his left meniscus with PT. Pt reports he actively goes to the gym and has a trainer while he also participates in the sport of Curling. Pt states he is consistently active and would like to progress to be able to jog on the treadmill again.    PERTINENT HISTORY: See PMH chart above  PAIN:  Are you having pain? Yes: NPRS scale: 0/10 at rest now, worst in past week 2/10 Pain location: L knee Pain description:   Aggravating factors:   Relieving factors:    PRECAUTIONS: Deep squatting/Lunging   RED FLAGS: None   WEIGHT BEARING RESTRICTIONS: No  FALLS:  Has patient fallen in last 6 months? No  LIVING ENVIRONMENT: Lives with: lives with their family Lives in: House/apartment Stairs: Yes: Internal: standard staircase 12-15 steps; on right going up Has following equipment at home: None  OCCUPATION: Desk Job   PLOF: Independent  PATIENT GOALS: To return to curling with no modifications and play pain free. Be able to jog again on the treadmill.   NEXT MD VISIT: Unknown   OBJECTIVE:  Note: Objective measures were completed at Evaluation unless otherwise noted.  DIAGNOSTIC FINDINGS: Reported LLE meniscal tear in chart and by pt; Imaging likely occurred outside of [facility] facility; ask upon next visit    COGNITION: Overall cognitive status: Within functional limits for tasks assessed     SENSATION: [facility]  LOWER EXTREMITY ROM:  WNL  LOWER EXTREMITY MMT:  WNL   GAIT: Distance walked: In clinic  Assistive device utilized: None Level of assistance: Complete Independence Comments: No significant gait deficits noted                                                                                                                                 TREATMENT DATE:   09/25/24  Elliptical L4x4 minutes forward/ L2.5x4 minutes backwards (reduced resistance backwards due to knee pain)   Bridges + ABD into green TB x12 Sidelying hip ABD green TB x12  Quadruped hip ABD  green TB x12 B Quadruped hip extension green TB x12 B Walking bridges x10  Sidelying hip hikes with swing x10 B      09/12/24- Eval    PATIENT EDUCATION:  Education details: HEP Person educated: Patient Education method: Medical Illustrator Education comprehension: verbalized understanding and returned demonstration  HOME EXERCISE PROGRAM:  Access Code: [email or web address] Date: 09/12/2024 Prepared by: Almetta Fam Exercises - Wall Quarter Squat  - 1 x daily - 7 x weekly - 2 sets - 10 reps - Supine Active Straight Leg Raise  - 1 x daily - 7 x weekly - 2 sets - 10 reps - Seated Knee Extension with Resistance  - 1 x daily - 7 x weekly - 2 sets - 10 reps - Seated Hamstring Curls with Resistance  - 1 x daily - 7 x weekly - 2 sets - 10 reps - Heel Raise on Step  - 1 x daily - 7 x weekly - 2 sets - 10 reps   ASSESSMENT:  CLINICAL IMPRESSION:  Arrived today feeling well, his trainer has been adjusting his program a bit and knee is feeling better. We worked on functional strengthening as tolerated and as reasonable given meniscus injury- asked him to keep avoiding deep squats and lunges for now, we will progress him in this realm in PT. Tolerated session well today overall.    EVAL: Patient is a 49 y.o. male who was seen today for physical therapy evaluation and treatment for L pain via LLE meniscus tear. 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 Pt participates in a Curling league; season ends in January. Pt exhibits good movement quality but faces deficits in LLE knee in movements of deep squatting/lunging due to increased stress it put onto the meniscus. Pt's torn meniscus has cause pain in LLE ROM and functional movement which presents as decreased capacity and endurance for high level activity. Pt will benefit from target exercise to strengthen musculature surrounding the knee joint and stretches  to relieve pressure surrounding the knee joint. PT focused on decreasing knee pain and increasing joint stability.   OBJECTIVE IMPAIRMENTS: decreased activity tolerance, decreased endurance, and pain.   ACTIVITY LIMITATIONS: lifting and squatting  PARTICIPATION LIMITATIONS:  Gym activity, Curling   PERSONAL FACTORS: 1-2 comorbidities:   are also affecting patient's functional outcome.   REHAB POTENTIAL: Good  CLINICAL DECISION MAKING: Stable/uncomplicated  EVALUATION COMPLEXITY: Low   GOALS: Goals reviewed with patient? Yes  SHORT TERM GOALS: Target date: 10/17/24  Patient will be independent with initial HEP. Baseline:  Goal status: INITIAL   LONG TERM GOALS: Target date: 11/21/24  Patient will be independent with advanced/ongoing HEP to improve outcomes and carryover.  Baseline:  Goal status: INITIAL  2.  Pt will be able to jog on treadmill again without pain Baseline:  Goal status: INITIAL  3. Patient will be able to ascend/descend stairs with no HR and reciprocal step pattern safely and without pain to access home and community.  Baseline:  Goal status: INITIAL  4. Pt will be able to perform all knee ROM without pain 0/10 NPS  Baseline: 3/10  Goal Status: Initial   5. Pt will be able to perform a deep squat and lunge without pain in order to return to PLOF gym routine and perform movements for participation in Curling.  Baseline:  Goal Status: INITIAL     PLAN:  PT FREQUENCY: 1x/week  PT DURATION: 10 weeks  PLANNED INTERVENTIONS: 97110-Therapeutic exercises, 97530- Therapeutic activity, [id number]- Neuromuscular re-education, 97535- Self Care, 02859- Manual therapy, [id number]- Gait training, [id number]- Ionotophoresis 4mg /ml Dexamethasone, and Patient/Family education  PLAN FOR NEXT SESSION: Light strengthening of musculature surrounding LLE knee, stretches, Ice if swelling noticeable. Avoid movements that increase stress to meniscus (deep squats or anything with deep  knee flexion)  Josette Rough, PT, DPT 09/25/2024 2:42 PM  "

## 2024-10-06 ENCOUNTER — Ambulatory Visit

## 2024-10-06 DIAGNOSIS — S83282A Other tear of lateral meniscus, current injury, left knee, initial encounter: Secondary | ICD-10-CM

## 2024-10-06 DIAGNOSIS — M25562 Pain in left knee: Secondary | ICD-10-CM

## 2024-10-06 DIAGNOSIS — M6281 Muscle weakness (generalized): Secondary | ICD-10-CM

## 2024-10-06 NOTE — Therapy (Signed)
 " OUTPATIENT PHYSICAL THERAPY LOWER EXTREMITY TREATMENT    Patient Name: Steve Weeks MRN: [id number] DOB:December 23, 1975, 49 y.o., male Today's Date: 10/06/2024  END OF SESSION:  PT End of Session - 10/06/24 0800     Visit Number 3    Date for Recertification  11/21/24    Authorization Type BCBS    PT Start Time 0800    PT Stop Time 0845    PT Time Calculation (min) 45 min    Activity Tolerance Patient tolerated treatment well    Behavior During Therapy [facility] for tasks assessed/performed         History reviewed. No pertinent past medical history. History reviewed. No pertinent surgical history. Patient Active Problem List   Diagnosis Date Noted   PALPITATIONS 03/18/2009   CHEST PAIN, LEFT 04/18/2008   Essential hypertension 03/02/2008   FOOT PAIN, LEFT 01/16/2008   HYPERLIPIDEMIA 11/05/2006   OBESITY 10/10/2006   Anxiety state 10/10/2006   DEPRESSION 10/10/2006   GERD 10/10/2006   LOW BACK PAIN, CHRONIC 10/10/2006   Sleep apnea 10/10/2006    PCP: Carol Catalan, PA-C  REFERRING PROVIDER: Arnaldo Juliene RAMAN, MD  REFERRING DIAG: M25.562 Pain in L knee   THERAPY DIAG:  Acute pain of left knee  Knee meniscus pain, left  Acute lateral meniscus tear of left knee, initial encounter  Muscle weakness (generalized)  Rationale for Evaluation and Treatment: Rehabilitation  ONSET DATE:   SUBJECTIVE:   SUBJECTIVE STATEMENT: Pt reported no pain this morning upon arrival of PT. Pt stated he has been able to perform his HEP without any problems; he reports only pain occasionally with certain movements. He reported that last session the Elliptical use in reverse did initiate some pain symptoms.     Eval: Pt stated his desire to avoid surgery for small tear of his left meniscus with PT. Pt reports he actively goes to the gym and has a trainer while he also participates in the sport of Curling. Pt states he is consistently active and would like to progress to be able to  jog on the treadmill again.    PERTINENT HISTORY: See PMH chart above  PAIN:  Are you having pain? Yes: NPRS scale: 0/10 at rest now, worst in past week 2/10 Pain location: L knee Pain description:   Aggravating factors:   Relieving factors:    PRECAUTIONS: Deep squatting/Lunging   RED FLAGS: None   WEIGHT BEARING RESTRICTIONS: No  FALLS:  Has patient fallen in last 6 months? No  LIVING ENVIRONMENT: Lives with: lives with their family Lives in: House/apartment Stairs: Yes: Internal: standard staircase 12-15 steps; on right going up Has following equipment at home: None  OCCUPATION: Desk Job   PLOF: Independent  PATIENT GOALS: To return to curling with no modifications and play pain free. Be able to jog again on the treadmill.   NEXT MD VISIT: Unknown   OBJECTIVE:  Note: Objective measures were completed at Evaluation unless otherwise noted.  DIAGNOSTIC FINDINGS: Reported LLE meniscal tear in chart and by pt; Imaging likely occurred outside of [facility] facility; ask upon next visit    COGNITION: Overall cognitive status: Within functional limits for tasks assessed     SENSATION: WFL    LOWER EXTREMITY ROM:  WNL  LOWER EXTREMITY MMT:  WNL   GAIT: Distance walked: In clinic  Assistive device utilized: None Level of assistance: Complete Independence Comments: No significant gait deficits noted  TREATMENT DATE:  10/06/24 Bike L2.5 x 6 min  SLR 2.5# 2x10 Glute Bridges w/ hip abduction 2x10 BlueTband STS with yellow weighted ball 4# 2x10 Elevated Calf Raises 4 step 2x15 Step Ups form airex to 6 2x10 HS stretch seated  Quad/hip flexor Stretch in supine  09/25/24  Elliptical L4x4 minutes forward/ L2.5x4 minutes backwards (reduced resistance backwards due to knee pain)   Bridges + ABD into green TB x12 Sidelying hip ABD  green TB x12  Quadruped hip ABD green TB x12 B Quadruped hip extension green TB x12 B Walking bridges x10  Sidelying hip hikes with swing x10 B      09/12/24- Eval    PATIENT EDUCATION:  Education details: HEP Person educated: Patient Education method: Medical Illustrator Education comprehension: verbalized understanding and returned demonstration  HOME EXERCISE PROGRAM:  Access Code: [email or web address] Date: 09/12/2024 Prepared by: Almetta Fam Exercises - Wall Quarter Squat  - 1 x daily - 7 x weekly - 2 sets - 10 reps - Supine Active Straight Leg Raise  - 1 x daily - 7 x weekly - 2 sets - 10 reps - Seated Knee Extension with Resistance  - 1 x daily - 7 x weekly - 2 sets - 10 reps - Seated Hamstring Curls with Resistance  - 1 x daily - 7 x weekly - 2 sets - 10 reps - Heel Raise on Step  - 1 x daily - 7 x weekly - 2 sets - 10 reps   ASSESSMENT:  CLINICAL IMPRESSION: Pt doing well with current regimen; pt stated he has been able to maintain his HEP and has avoided painful movements. Pt tolerated treatment well with no increase in knee pain. Session focused on strengthening musculature surrounding the knee joint, ankle stability movements, and stretches. Pt with good movement quality required minor verbal cues and only SBA today.    EVAL: Patient is a 49 y.o. male who was seen today for physical therapy evaluation and treatment for L pain via LLE meniscus tear. 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 Pt participates in a Curling league; season ends in January. Pt exhibits good movement quality but faces deficits in LLE knee in movements of deep squatting/lunging due to increased stress it put onto the meniscus. Pt's torn meniscus has cause pain in LLE ROM and functional movement which presents as decreased capacity and endurance for high level activity. Pt will benefit from target exercise to  strengthen musculature surrounding the knee joint and stretches to relieve pressure surrounding the knee joint. PT focused on decreasing knee pain and increasing joint stability.   OBJECTIVE IMPAIRMENTS: decreased activity tolerance, decreased endurance, and pain.   ACTIVITY LIMITATIONS: lifting and squatting  PARTICIPATION LIMITATIONS: Gym activity, Curling   PERSONAL FACTORS: 1-2 comorbidities:   are also affecting patient's functional outcome.   REHAB POTENTIAL: Good  CLINICAL DECISION MAKING: Stable/uncomplicated  EVALUATION COMPLEXITY: Low   GOALS: Goals reviewed with patient? Yes  SHORT TERM GOALS: Target date: 10/17/24  Patient will be independent with initial HEP. Baseline:  Goal status: MET 10/06/24   LONG TERM GOALS: Target date: 11/21/24  Patient will be independent with advanced/ongoing HEP to improve outcomes and carryover.  Baseline:  Goal status: IN PROGRESS 10/06/24  2.  Pt will be able to jog on treadmill again without pain Baseline:  Goal status: IN PROGRESS unable to do currently 10/06/24  3. Patient will be able to ascend/descend stairs with no HR and reciprocal step pattern safely and without pain to access home and community.  Baseline:  Goal status: IN PROGRESS reduced pain but quality and pace deceased 2024/10/09  4. Pt will be able to perform all knee ROM without pain 0/10 NPS  Baseline: 3/10  Goal Status: IN PROGRESS 1/10 2024/10/09  5. Pt will be able to perform a deep squat and lunge without pain in order to return to PLOF gym routine and perform movements for participation in Curling.  Baseline:  Goal Status: IN PROGRESS avoiding deep lunge and squats currently Oct 09, 2024    PLAN:  PT FREQUENCY: 1x/week  PT DURATION: 10 weeks  PLANNED INTERVENTIONS: 97110-Therapeutic exercises, 97530- Therapeutic activity, 97112- Neuromuscular re-education, 97535- Self Care, 02859- Manual therapy, [phone]- Gait training, [phone]- Ionotophoresis 4mg /ml Dexamethasone,  and Patient/Family education  PLAN FOR NEXT SESSION: Light strengthening of musculature surrounding LLE knee, stretches, Ice if swelling noticeable. Avoid movements that increase stress to meniscus (deep squats or anything with deep knee flexion)  Thersia Alder, Student-PT 10/09/24 8:03 AM  "

## 2024-10-13 ENCOUNTER — Ambulatory Visit

## 2024-10-13 DIAGNOSIS — M6281 Muscle weakness (generalized): Secondary | ICD-10-CM

## 2024-10-13 DIAGNOSIS — M5459 Other low back pain: Secondary | ICD-10-CM

## 2024-10-13 DIAGNOSIS — M5416 Radiculopathy, lumbar region: Secondary | ICD-10-CM

## 2024-10-13 DIAGNOSIS — M25562 Pain in left knee: Secondary | ICD-10-CM

## 2024-10-13 DIAGNOSIS — S83282A Other tear of lateral meniscus, current injury, left knee, initial encounter: Secondary | ICD-10-CM

## 2024-10-13 NOTE — Therapy (Signed)
 " OUTPATIENT PHYSICAL THERAPY LOWER EXTREMITY TREATMENT    Patient Name: Steve Weeks MRN: [id number] DOB:1975-09-30, 49 y.o., male Today's Date: 10/13/2024  END OF SESSION:  PT End of Session - 10/13/24 0804     Visit Number 4    Date for Recertification  11/21/24    Authorization Type BCBS    PT Start Time 0802    PT Stop Time 0845    PT Time Calculation (min) 43 min    Activity Tolerance Patient tolerated treatment well    Behavior During Therapy [facility] for tasks assessed/performed         History reviewed. No pertinent past medical history. History reviewed. No pertinent surgical history. Patient Active Problem List   Diagnosis Date Noted   PALPITATIONS 03/18/2009   CHEST PAIN, LEFT 04/18/2008   Essential hypertension 03/02/2008   FOOT PAIN, LEFT 01/16/2008   HYPERLIPIDEMIA 11/05/2006   OBESITY 10/10/2006   Anxiety state 10/10/2006   DEPRESSION 10/10/2006   GERD 10/10/2006   LOW BACK PAIN, CHRONIC 10/10/2006   Sleep apnea 10/10/2006    PCP: Carol Catalan, PA-C  REFERRING PROVIDER: Arnaldo Juliene RAMAN, MD  REFERRING DIAG: M25.562 Pain in L knee   THERAPY DIAG:  Acute pain of left knee  Muscle weakness (generalized)  Knee meniscus pain, left  Other low back pain  Acute lateral meniscus tear of left knee, initial encounter  Radiculopathy, lumbar region  Rationale for Evaluation and Treatment: Rehabilitation  ONSET DATE:   SUBJECTIVE:   SUBJECTIVE STATEMENT: Pt reported no pain this morning upon arrival of PT. Pt stated only gets mild discomfort when he sits wrong with his legs crossed for longer durations of time.    Eval: Pt stated his desire to avoid surgery for small tear of his left meniscus with PT. Pt reports he actively goes to the gym and has a trainer while he also participates in the sport of Curling. Pt states he is consistently active and would like to progress to be able to jog on the treadmill again.    PERTINENT HISTORY: See  PMH chart above  PAIN:  Are you having pain? Yes: NPRS scale: 0/10 at rest now, worst in past week 2/10 Pain location: L knee Pain description:   Aggravating factors:   Relieving factors:    PRECAUTIONS: Deep squatting/Lunging   RED FLAGS: None   WEIGHT BEARING RESTRICTIONS: No  FALLS:  Has patient fallen in last 6 months? No  LIVING ENVIRONMENT: Lives with: lives with their family Lives in: House/apartment Stairs: Yes: Internal: standard staircase 12-15 steps; on right going up Has following equipment at home: None  OCCUPATION: Desk Job   PLOF: Independent  PATIENT GOALS: To return to curling with no modifications and play pain free. Be able to jog again on the treadmill.   NEXT MD VISIT: Unknown   OBJECTIVE:  Note: Objective measures were completed at Evaluation unless otherwise noted.  DIAGNOSTIC FINDINGS: Reported LLE meniscal tear in chart and by pt; Imaging likely occurred outside of [facility] facility; ask upon next visit    COGNITION: Overall cognitive status: Within functional limits for tasks assessed     SENSATION: WFL    LOWER EXTREMITY ROM:  WNL  LOWER EXTREMITY MMT:  WNL   GAIT: Distance walked: In clinic  Assistive device utilized: None Level of assistance: Complete Independence Comments: No significant gait deficits noted  TREATMENT DATE:  10/13/24 NuStep L5 x 6 min  HS stretch Glute bridge marching 2x10 Supine hip abduction Blue Tband HS Cursl 1x10 Blue Tband  HS curls 2x10 35# Leg ext 25# 2x10 Wall squats 2x10 Elevated Calf raises 1x15 Elevated Heel Taps L LE on 6 2x10   10/06/24 Bike L2.5 x 6 min  SLR 2.5# 2x10 Glute Bridges w/ hip abduction 2x10 BlueTband STS with yellow weighted ball 4# 2x10 Elevated Calf Raises 4 step 2x15 Step Ups form airex to 6 2x10 HS stretch seated  Quad/hip flexor  Stretch in supine  09/25/24  Elliptical L4x4 minutes forward/ L2.5x4 minutes backwards (reduced resistance backwards due to knee pain)   Bridges + ABD into green TB x12 Sidelying hip ABD green TB x12  Quadruped hip ABD green TB x12 B Quadruped hip extension green TB x12 B Walking bridges x10  Sidelying hip hikes with swing x10 B      09/12/24- Eval    PATIENT EDUCATION:  Education details: HEP Person educated: Patient Education method: Medical Illustrator Education comprehension: verbalized understanding and returned demonstration  HOME EXERCISE PROGRAM:  Access Code: [email or web address] Date: 09/12/2024 Prepared by: Almetta Fam Exercises - Wall Quarter Squat  - 1 x daily - 7 x weekly - 2 sets - 10 reps - Supine Active Straight Leg Raise  - 1 x daily - 7 x weekly - 2 sets - 10 reps - Seated Knee Extension with Resistance  - 1 x daily - 7 x weekly - 2 sets - 10 reps - Seated Hamstring Curls with Resistance  - 1 x daily - 7 x weekly - 2 sets - 10 reps - Heel Raise on Step  - 1 x daily - 7 x weekly - 2 sets - 10 reps   ASSESSMENT:  CLINICAL IMPRESSION: Pt able to tolerate some added weight training today in a non weight bearing position utilizing leg ext and HS curl machine. Pt with noticeable L LE knee hyperextension with SL stance particularly with quad eccentric movement of heel taps. Pt doing well with current regimen; pt stated he has been able to maintain his HEP and has avoided painful movements. Pt tolerated treatment well with no increase in knee pain. Session focused on strengthening musculature surrounding the knee joint, ankle stability movements, and stretches. Pt with good movement quality required minor verbal cues and only SBA today.    EVAL: Patient is a 49 y.o. male who was seen today for physical therapy evaluation and treatment for L pain via LLE meniscus tear. 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 Pt participates in a Curling league; season ends in January. Pt exhibits good movement quality but faces deficits in LLE knee in movements of deep squatting/lunging due to increased stress it put onto the meniscus. Pt's torn meniscus has cause pain in LLE ROM and functional movement which presents as decreased capacity and endurance for high level activity. Pt will benefit from target exercise to strengthen musculature surrounding the knee joint and stretches to relieve pressure surrounding the knee joint. PT focused on decreasing knee pain and increasing joint stability.   OBJECTIVE IMPAIRMENTS: decreased activity tolerance, decreased endurance, and pain.   ACTIVITY LIMITATIONS: lifting and squatting  PARTICIPATION LIMITATIONS: Gym activity, Curling   PERSONAL FACTORS: 1-2 comorbidities:   are also affecting patient's functional outcome.   REHAB POTENTIAL: Good  CLINICAL DECISION MAKING:  Stable/uncomplicated  EVALUATION COMPLEXITY: Low   GOALS: Goals reviewed with patient? Yes  SHORT TERM GOALS: Target date: 10/17/24  Patient will be independent with initial HEP. Baseline:  Goal status: MET 10/11/24   LONG TERM GOALS: Target date: 11/21/24  Patient will be independent with advanced/ongoing HEP to improve outcomes and carryover.  Baseline:  Goal status: IN PROGRESS 10/13/24  2.  Pt will be able to jog on treadmill again without pain Baseline:  Goal status: IN PROGRESS unable to do currently 10/13/24  3. Patient will be able to ascend/descend stairs with no HR and reciprocal step pattern safely and without pain to access home and community.  Baseline:  Goal status: IN PROGRESS reduced pain but quality and pace deceased 11-Oct-2024  4. Pt will be able to perform all knee ROM without pain 0/10 NPS  Baseline: 3/10  Goal Status: IN PROGRESS 1/10 2024-10-11  5. Pt will be able to perform a deep squat and lunge without pain in order to return to PLOF gym  routine and perform movements for participation in Curling.  Baseline:  Goal Status: IN PROGRESS avoiding deep lunge and squats currently 10/13/24    PLAN:  PT FREQUENCY: 1x/week  PT DURATION: 10 weeks  PLANNED INTERVENTIONS: 97110-Therapeutic exercises, 97530- Therapeutic activity, 97112- Neuromuscular re-education, 97535- Self Care, 02859- Manual therapy, [phone]- Gait training, [phone]- Ionotophoresis 4mg /ml Dexamethasone, and Patient/Family education  PLAN FOR NEXT SESSION: Light strengthening of musculature surrounding LLE knee, stretches, Ice if swelling noticeable. Avoid movements that increase stress to meniscus (deep squats or anything with deep knee flexion)  Thersia Alder, Student-PT 10/13/24 8:05 AM  "

## 2024-10-17 ENCOUNTER — Other Ambulatory Visit (HOSPITAL_BASED_OUTPATIENT_CLINIC_OR_DEPARTMENT_OTHER): Payer: Self-pay

## 2024-10-17 MED ORDER — WEGOVY 2.4 MG/0.75ML ~~LOC~~ SOAJ
2.4000 mg | SUBCUTANEOUS | 1 refills | Status: AC
  Filled 2024-10-17: qty 3, 28d supply, fill #0

## 2024-10-25 ENCOUNTER — Ambulatory Visit

## 2024-10-25 DIAGNOSIS — S83282A Other tear of lateral meniscus, current injury, left knee, initial encounter: Secondary | ICD-10-CM

## 2024-10-25 DIAGNOSIS — M6281 Muscle weakness (generalized): Secondary | ICD-10-CM

## 2024-10-25 DIAGNOSIS — M25562 Pain in left knee: Secondary | ICD-10-CM

## 2024-10-25 DIAGNOSIS — M5459 Other low back pain: Secondary | ICD-10-CM

## 2024-10-25 DIAGNOSIS — M5416 Radiculopathy, lumbar region: Secondary | ICD-10-CM

## 2024-10-25 NOTE — Therapy (Signed)
 " OUTPATIENT PHYSICAL THERAPY LOWER EXTREMITY TREATMENT    Patient Name: Steve Weeks MRN: [id number] DOB:1975/11/05, 49 y.o., male Today's Date: 10/25/2024  END OF SESSION:  PT End of Session - 10/25/24 1615     Visit Number 5    Date for Recertification  11/21/24    PT Start Time 1538    PT Stop Time 1613    PT Time Calculation (min) 35 min    Activity Tolerance Patient tolerated treatment well    Behavior During Therapy [facility] for tasks assessed/performed          History reviewed. No pertinent past medical history. History reviewed. No pertinent surgical history. Patient Active Problem List   Diagnosis Date Noted   PALPITATIONS 03/18/2009   CHEST PAIN, LEFT 04/18/2008   Essential hypertension 03/02/2008   FOOT PAIN, LEFT 01/16/2008   HYPERLIPIDEMIA 11/05/2006   OBESITY 10/10/2006   Anxiety state 10/10/2006   DEPRESSION 10/10/2006   GERD 10/10/2006   LOW BACK PAIN, CHRONIC 10/10/2006   Sleep apnea 10/10/2006    PCP: Carol Catalan, PA-C  REFERRING PROVIDER: Arnaldo Juliene RAMAN, MD  REFERRING DIAG: M25.562 Pain in L knee   THERAPY DIAG:  Acute pain of left knee  Other low back pain  Acute lateral meniscus tear of left knee, initial encounter  Muscle weakness (generalized)  Knee meniscus pain, left  Radiculopathy, lumbar region  Rationale for Evaluation and Treatment: Rehabilitation  ONSET DATE:   SUBJECTIVE:   SUBJECTIVE STATEMENT: Pt reported no pain upon arrival of PT today. Pt reports his knee was a little sore yesterday due to prolonged standing working a basketball game.    Eval: Pt stated his desire to avoid surgery for small tear of his left meniscus with PT. Pt reports he actively goes to the gym and has a trainer while he also participates in the sport of Curling. Pt states he is consistently active and would like to progress to be able to jog on the treadmill again.    PERTINENT HISTORY: See PMH chart above  PAIN:  Are you having  pain? Yes: NPRS scale: 0/10 at rest now, worst in past week 2/10 Pain location: L knee Pain description:   Aggravating factors:   Relieving factors:    PRECAUTIONS: Deep squatting/Lunging   RED FLAGS: None   WEIGHT BEARING RESTRICTIONS: No  FALLS:  Has patient fallen in last 6 months? No  LIVING ENVIRONMENT: Lives with: lives with their family Lives in: House/apartment Stairs: Yes: Internal: standard staircase 12-15 steps; on right going up Has following equipment at home: None  OCCUPATION: Desk Job   PLOF: Independent  PATIENT GOALS: To return to curling with no modifications and play pain free. Be able to jog again on the treadmill.   NEXT MD VISIT: Unknown   OBJECTIVE:  Note: Objective measures were completed at Evaluation unless otherwise noted.  DIAGNOSTIC FINDINGS: Reported LLE meniscal tear in chart and by pt; Imaging likely occurred outside of [facility] facility; ask upon next visit    COGNITION: Overall cognitive status: Within functional limits for tasks assessed     SENSATION: WFL    LOWER EXTREMITY ROM:  WNL  LOWER EXTREMITY MMT:  WNL   GAIT: Distance walked: In clinic  Assistive device utilized: None Level of assistance: Complete Independence Comments: No significant gait deficits noted  TREATMENT DATE:  10/25/24 Nustep L5 x 6 min  Wall Squats 2x10 Lateral Step Ups L LE leading 2x10 6 Hip Ext 2x10 5# on cable machine Lateral resisted walking 30# Leg ext 2x10 25# HS curls 2x10 35#  10/13/24 NuStep L5 x 6 min  HS stretch Glute bridge marching 2x10 Supine hip abduction Blue Tband HS Cursl 1x10 Blue Tband  HS curls 2x10 35# Leg ext 25# 2x10 Wall squats 2x10 Elevated Calf raises 1x15 Elevated Heel Taps L LE on 6 2x10   10/06/24 Bike L2.5 x 6 min  SLR 2.5# 2x10 Glute Bridges w/ hip abduction 2x10  BlueTband STS with yellow weighted ball 4# 2x10 Elevated Calf Raises 4 step 2x15 Step Ups form airex to 6 2x10 HS stretch seated  Quad/hip flexor Stretch in supine  09/25/24  Elliptical L4x4 minutes forward/ L2.5x4 minutes backwards (reduced resistance backwards due to knee pain)   Bridges + ABD into green TB x12 Sidelying hip ABD green TB x12  Quadruped hip ABD green TB x12 B Quadruped hip extension green TB x12 B Walking bridges x10  Sidelying hip hikes with swing x10 B      09/12/24- Eval    PATIENT EDUCATION:  Education details: HEP Person educated: Patient Education method: Medical Illustrator Education comprehension: verbalized understanding and returned demonstration  HOME EXERCISE PROGRAM:  Access Code: [email or web address] Date: 09/12/2024 Prepared by: Almetta Fam Exercises - Wall Quarter Squat  - 1 x daily - 7 x weekly - 2 sets - 10 reps - Supine Active Straight Leg Raise  - 1 x daily - 7 x weekly - 2 sets - 10 reps - Seated Knee Extension with Resistance  - 1 x daily - 7 x weekly - 2 sets - 10 reps - Seated Hamstring Curls with Resistance  - 1 x daily - 7 x weekly - 2 sets - 10 reps - Heel Raise on Step  - 1 x daily - 7 x weekly - 2 sets - 10 reps   ASSESSMENT:  CLINICAL IMPRESSION: Pt doing well with current regimen with overall pain levels decreasing with gradual; progression and ability to tolerate more exercises. Pt able to tolerate some added weight training today in a non weight bearing position utilizing leg ext and HS curl machine. Pt tolerated treatment well with no increase in knee pain. Session focused on strengthening musculature surrounding the knee joint and ankle stability movements. Pt with good movement quality required minor verbal cues and only SBA today.    EVAL: Patient is a 49 y.o. male who was seen today for physical therapy evaluation and treatment for L pain via LLE meniscus tear. 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 Pt participates in a Curling league; season ends in January. Pt exhibits good movement quality but faces deficits in LLE knee in movements of deep squatting/lunging due to increased stress it put onto the meniscus. Pt's torn meniscus has cause pain in LLE ROM and functional movement which presents as decreased capacity and endurance for high level activity. Pt will benefit from target exercise to strengthen musculature surrounding the knee joint and stretches to relieve pressure surrounding the knee joint. PT focused on decreasing knee pain and increasing joint stability.   OBJECTIVE IMPAIRMENTS: decreased activity tolerance, decreased endurance, and pain.   ACTIVITY LIMITATIONS: lifting and squatting  PARTICIPATION LIMITATIONS: Gym activity, Curling   PERSONAL FACTORS: 1-2  comorbidities:   are also affecting patient's functional outcome.   REHAB POTENTIAL: Good  CLINICAL DECISION MAKING: Stable/uncomplicated  EVALUATION COMPLEXITY: Low   GOALS: Goals reviewed with patient? Yes  SHORT TERM GOALS: Target date: 10/17/24  Patient will be independent with initial HEP. Baseline:  Goal status: MET 10-10-24   LONG TERM GOALS: Target date: 11/21/24  Patient will be independent with advanced/ongoing HEP to improve outcomes and carryover.  Baseline:  Goal status: IN PROGRESS 10/25/24  2.  Pt will be able to jog on treadmill again without pain Baseline:  Goal status: IN PROGRESS unable to do currently 10/25/24  3. Patient will be able to ascend/descend stairs with no HR and reciprocal step pattern safely and without pain to access home and community.  Baseline:  Goal status: IN PROGRESS reduced pain but quality and pace deceased 2024/10/10  4. Pt will be able to perform all knee ROM without pain 0/10 NPS  Baseline: 3/10  Goal Status: IN PROGRESS 1/10 10/25/24  5. Pt will be able to perform a deep squat and lunge  without pain in order to return to PLOF gym routine and perform movements for participation in Curling.  Baseline:  Goal Status: IN PROGRESS avoiding deep lunge and squats currently 10/25/24    PLAN:  PT FREQUENCY: 1x/week  PT DURATION: 10 weeks  PLANNED INTERVENTIONS: 97110-Therapeutic exercises, 97530- Therapeutic activity, 97112- Neuromuscular re-education, 97535- Self Care, [id number]- Manual therapy, [phone]- Gait training, [phone]- Ionotophoresis 4mg /ml Dexamethasone, and Patient/Family education  PLAN FOR NEXT SESSION: Light strengthening of musculature surrounding LLE knee, stretches, Ice if swelling noticeable. Avoid movements that increase stress to meniscus (deep squats or anything with deep knee flexion)  Thersia Alder, Student-PT 10/25/24 4:18 PM  "

## 2024-10-31 ENCOUNTER — Ambulatory Visit: Admitting: Physical Therapy

## 2024-11-07 ENCOUNTER — Ambulatory Visit: Admitting: Physical Therapy

## 2024-11-14 ENCOUNTER — Ambulatory Visit: Admitting: Physical Therapy
